# Patient Record
Sex: Male | Born: 1960 | Hispanic: No | Marital: Married | Smoking: Former smoker
Health system: Southern US, Community
[De-identification: ages and names within clinical notes are randomized; demographics above are authoritative.]

## PROBLEM LIST (undated history)

## (undated) ENCOUNTER — Ambulatory Visit: Attending: Family Medicine | Admitting: Family Medicine

## (undated) HISTORY — PX: APPENDECTOMY: SHX54

---

## 2007-11-10 ENCOUNTER — Emergency Department (HOSPITAL_COMMUNITY): Admission: EM | Admit: 2007-11-10 | Discharge: 2007-11-10 | Payer: Self-pay | Admitting: Emergency Medicine

## 2008-06-21 ENCOUNTER — Emergency Department (HOSPITAL_COMMUNITY): Admission: EM | Admit: 2008-06-21 | Discharge: 2008-06-21 | Payer: Self-pay | Admitting: Family Medicine

## 2008-09-22 ENCOUNTER — Encounter (INDEPENDENT_AMBULATORY_CARE_PROVIDER_SITE_OTHER): Payer: Self-pay | Admitting: General Surgery

## 2008-09-22 ENCOUNTER — Emergency Department (HOSPITAL_COMMUNITY): Admission: EM | Admit: 2008-09-22 | Discharge: 2008-09-22 | Payer: Self-pay | Admitting: Family Medicine

## 2008-09-22 ENCOUNTER — Observation Stay (HOSPITAL_COMMUNITY): Admission: EM | Admit: 2008-09-22 | Discharge: 2008-09-23 | Payer: Self-pay | Admitting: Emergency Medicine

## 2010-08-19 LAB — URINALYSIS, ROUTINE W REFLEX MICROSCOPIC
Glucose, UA: NEGATIVE mg/dL
Hgb urine dipstick: NEGATIVE
Specific Gravity, Urine: 1.034 — ABNORMAL HIGH (ref 1.005–1.030)
pH: 6 (ref 5.0–8.0)

## 2010-08-19 LAB — POCT I-STAT, CHEM 8
BUN: 10 mg/dL (ref 6–23)
Chloride: 104 mEq/L (ref 96–112)
Creatinine, Ser: 1.2 mg/dL (ref 0.4–1.5)
Potassium: 4.4 mEq/L (ref 3.5–5.1)
Sodium: 137 mEq/L (ref 135–145)

## 2010-08-19 LAB — DIFFERENTIAL
Basophils Absolute: 0.1 10*3/uL (ref 0.0–0.1)
Eosinophils Relative: 1 % (ref 0–5)
Lymphocytes Relative: 18 % (ref 12–46)
Lymphs Abs: 1.8 10*3/uL (ref 0.7–4.0)
Neutro Abs: 7.1 10*3/uL (ref 1.7–7.7)

## 2010-08-19 LAB — CBC
HCT: 41.8 % (ref 39.0–52.0)
Platelets: 214 10*3/uL (ref 150–400)
WBC: 10 10*3/uL (ref 4.0–10.5)

## 2010-08-26 LAB — CBC
HCT: 42.3 % (ref 39.0–52.0)
MCV: 87 fL (ref 78.0–100.0)
Platelets: 291 10*3/uL (ref 150–400)
RBC: 4.86 MIL/uL (ref 4.22–5.81)
WBC: 4.4 10*3/uL (ref 4.0–10.5)

## 2010-08-26 LAB — POCT I-STAT, CHEM 8
BUN: 13 mg/dL (ref 6–23)
Calcium, Ion: 1.18 mmol/L (ref 1.12–1.32)
Chloride: 102 mEq/L (ref 96–112)
Creatinine, Ser: 1 mg/dL (ref 0.4–1.5)
Sodium: 139 mEq/L (ref 135–145)
TCO2: 28 mmol/L (ref 0–100)

## 2010-08-26 LAB — DIFFERENTIAL
Eosinophils Absolute: 0.3 10*3/uL (ref 0.0–0.7)
Eosinophils Relative: 6 % — ABNORMAL HIGH (ref 0–5)
Lymphocytes Relative: 47 % — ABNORMAL HIGH (ref 12–46)
Lymphs Abs: 2.1 10*3/uL (ref 0.7–4.0)
Monocytes Relative: 12 % (ref 3–12)

## 2010-08-26 LAB — TSH: TSH: 0.466 u[IU]/mL (ref 0.350–4.500)

## 2010-09-23 NOTE — Op Note (Signed)
NAMEDEKOTA, SHENK                 ACCOUNT NO.:  [id number]   MEDICAL RECORD NO.:  [id number]          PATIENT TYPE:  INP   LOCATION:  5151                         FACILITY:  [facility]   PHYSICIAN:  Gabrielle Dare. Janee Morn, M.D.DATE OF BIRTH:  25-Apr-1961   DATE OF PROCEDURE:  09/22/2008  DATE OF DISCHARGE:                               OPERATIVE REPORT   PREOPERATIVE DIAGNOSIS:  Acute appendicitis.   POSTOPERATIVE DIAGNOSIS:  Acute appendicitis.   PROCEDURE:  Laparoscopic appendectomy.   SURGEON:  Gabrielle Dare. Janee Morn, MD   ANESTHESIA:  General endotracheal.   HISTORY OF PRESENT ILLNESS:  Mr. Koeppen is a 50 year old African  American gentleman who is originally from [place], who presented to the  emergency department with right lower quadrant abdominal pain.  CT scan  demonstrated early acute appendicitis and he was brought to the  emergently to the operating room for appendectomy.   PROCEDURE IN DETAIL:  Informed consent was obtained.  The patient was  identified in the preop holding area.  He received intravenous  antibiotics.  He was brought to the operating room and general  endotracheal anesthesia was administered by the anesthesia staff.  His  abdomen was prepped and draped in a sterile fashion after a Foley  catheter was placed by the nursing staff.  A time-out procedure was  done.  An infraumbilical area was infiltrated with 0.25% Marcaine.  An  infraumbilical incision was made.  Subcutaneous tissues were dissected  down revealing the anterior fascia.  This was divided sharply along the  midline.  The peritoneal cavity was entered under direct vision without  difficulty.  A 0 Vicryl pursestring suture was placed around the fascial  opening.  The Hasson trocar was inserted into the abdomen and the  abdomen was insufflated with carbon dioxide in a standard fashion.  Under direct vision, a 12-mm left lower quadrant port and one 5-mm right  mid abdomen port were placed.  A 0.25%  Marcaine with epinephrine was  used in both port sites.  Laparoscopic exploration revealed a dilated  and inflamed appendix.  It was not perforated.  The appendix was  retracted superiorly.  The mesoappendix was gradually divided with the  Harmonic scalpel, achieving excellent hemostasis.  Once we dissected  down to the base, the base was divided with endoscopic GIA with a  vascular load.  Then, the appendix was placed in an EndoCatch bag and  removed from the abdomen via the left lower quadrant port site.  The  abdomen was copiously irrigated with saline.  There was one tiny spot of  bleeding along the staple line that was cauterized.  The staple line  remained intact.  There was no further bleeding.  The abdomen was  copiously irrigated.  Irrigation fluid returned clear.  Staple lines  were rechecked and the mesoappendix and staple line remained completely  dry. The ports were then removed under direct vision. The  pneumoperitoneum was released.  The infraumbilical fascia was closed by  tiny 0-Vicryl pursestring suture with care not to trap any  intraabdominal contents.  All 3 wounds were copiously  irrigated and the  skin of each was closed with  a running 4-0 Vicryl subcuticular stitch followed by Dermabond.  The  sponge, needle, and instrument counts were correct. The patient  tolerated the procedure well without apparent complications and was  taken to the recovery room in stable condition.      Gabrielle Dare Janee Morn, M.D.  Electronically Signed     BET/MEDQ  D:  09/22/2008  T:  09/23/2008  Job:  [id number]

## 2010-09-23 NOTE — H&P (Signed)
Daniel Coleman, Daniel Coleman                 ACCOUNT NO.:  [id number]   MEDICAL RECORD NO.:  [id number]          PATIENT TYPE:  EMS   LOCATION:  URG                          FACILITY:  [facility]   PHYSICIAN:  Gabrielle Dare. Janee Morn, M.D.DATE OF BIRTH:  10-Jun-1960   DATE OF ADMISSION:  09/22/2008  DATE OF DISCHARGE:  09/22/2008                              HISTORY & PHYSICAL   CHIEF COMPLAINT:  Right lower quadrant abdominal pain.   HISTORY OF PRESENT ILLNESS:  Daniel Coleman is a 50 year old African  American gentleman who is originally from [place].  He developed right  lower quadrant abdominal pain last night, it has persisted.  He has not  had any significant nausea and vomiting.  He came to the [facility]  Emergency Department today.  Workup here shows white blood cell count  10,000.  CT scan of the abdomen and pelvis was done, which shows early  acute appendicitis.   PAST MEDICAL HISTORY:  Negative.   PAST SURGICAL HISTORY:  Negative.   SOCIAL HISTORY:  He quit smoking in December 2009.  He does not drink  alcohol.  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   MEDICATIONS:  None.   ALLERGIES:  No known drug allergies.   REVIEW OF SYSTEMS:  Include GI as above, otherwise negative.   PHYSICAL EXAMINATION:  VITAL SIGNS:  Temperature 97.9, pulse 71,  respirations 18, blood pressure 131/76.  GENERAL:  He is awake and alert.  He is in no distress.  HEENT: Pupils equal and reactive.  Sclerae is mildly injected.  Oral  mucosa is moist.  NECK:  Supple with no tenderness.  LUNGS:  Clear to auscultation.  HEART:  Regular with no murmurs and pulses palpable in left chest.  ABDOMEN:  Tenderness in the right lower quadrant with voluntary  guarding.  There is no Rovsing sign and no psoas sign.  Bowel sounds are  hypoactive.  No masses are felt.  There is no other localized  tenderness.  SKIN:  Warm and dry with no rashes.  NEUROLOGIC:  No focal deficit.  EXTREMITIES:  No deformity  or tenderness.   White blood cell count 10,000, hemoglobin 14.2, platelets 214.  Sodium  137, potassium 4.4, chloride 104, CO2 of 25, BUN 10, creatinine 1.2,  glucose 83.   IMPRESSION:  Acute appendicitis.   PLAN:  We will take him to the operating room for laparoscopic  appendectomy.  Procedure, risks, and benefits were discussed in detail  with the patient.  He agrees.  We will also give intravenous antibiotics  now.      Gabrielle Dare Janee Morn, M.D.  Electronically Signed     BET/MEDQ  D:  09/22/2008  T:  09/23/2008  Job:  [id number]

## 2010-09-26 NOTE — Discharge Summary (Signed)
Daniel Coleman, Daniel Coleman                 ACCOUNT NO.:  [id number]   MEDICAL RECORD NO.:  [id number]          PATIENT TYPE:  INP   LOCATION:  5151                         FACILITY:  [facility]   PHYSICIAN:  Sandria Bales. Ezzard Standing, M.D.  DATE OF BIRTH:  1960-09-23   DATE OF ADMISSION:  09/22/2008  DATE OF DISCHARGE:  09/23/2008                               DISCHARGE SUMMARY   DISCHARGING PHYSICIAN:  Dr. Ezzard Standing.   CHIEF COMPLAINT AND REASON FOR ADMISSION:  Daniel Coleman is a 50 year old  male patient who has had right lower quadrant abdominal pain for at  least 24 hours.  No nausea and vomiting.  He presented to the ER and was  found to be afebrile.  Vital signs were stable.  Abdominal exam was  consistent with possible appendicitis.  White count was 10,000.  CT was  consistent with early appendicitis.  The patient was admitted by Dr.  Janee Morn with following diagnosis; early acute appendicitis.   HOSPITAL COURSE:  The patient was admitted and was taken to the OR where  he underwent laparoscopic appendectomy for acute nonperforated  appendicitis.  He was sent back to the general floor to recover.   By postop day #1, the patient was eating breakfast, tolerating solid  diet, BP was 114/83, pulse 64, and temperature 98.5.  Abdomen was soft.  Bowel sounds were present.  Wounds were stable and he was otherwise  deemed appropriate for discharge home.   FINAL DISCHARGE DIAGNOSIS:  Acute appendicitis status post laparoscopic  appendectomy.   DISCHARGE MEDICATIONS:  The patient will resume Tylenol PM as prior to  admission.  In addition, he has been given generic of Percocet to use for pain as  well as been given a copy of home care instructions for laparoscopic  procedures by [facility].  Please refer to this for  details.   He is not to lift anything more than 10 pounds for 4 weeks and he is to  return to work in 1-2 weeks as directed by Dr. Janee Morn.  He is to call Dr. Carollee Massed office to  be seen in 3 weeks.      Allison L. Rennis Harding, N.P.      Sandria Bales. Ezzard Standing, M.D.  Electronically Signed    ALE/MEDQ  D:  11/06/2008  T:  11/07/2008  Job:  [id number]   cc:   Gabrielle Dare. Janee Morn, M.D.

## 2011-02-05 LAB — COMPREHENSIVE METABOLIC PANEL
BUN: 16
CO2: 29
Calcium: 9.8
Chloride: 103
Creatinine, Ser: 1.07
GFR calc Af Amer: >60
GFR calc non Af Amer: >60
Total Bilirubin: 1.3 — ABNORMAL HIGH

## 2011-02-05 LAB — DIFFERENTIAL
Basophils Relative: 1
Eosinophils Absolute: 0.2
Lymphocytes Relative: 38
Lymphs Abs: 1.5
Neutro Abs: 1.7
Neutrophils Relative %: 44

## 2011-02-05 LAB — CBC
HCT: 45.2
MCHC: 34.3
MCV: 87.5
RBC: 5.16
WBC: 3.9 — ABNORMAL LOW

## 2015-04-22 DIAGNOSIS — N200 Calculus of kidney: Secondary | ICD-10-CM | POA: Insufficient documentation

## 2016-03-25 DIAGNOSIS — M479 Spondylosis, unspecified: Secondary | ICD-10-CM | POA: Insufficient documentation

## 2016-12-01 ENCOUNTER — Ambulatory Visit
Admission: EM | Admit: 2016-12-01 | Discharge: 2016-12-01 | Disposition: A | Discharge status: Home / Self Care | Payer: BLUE CROSS/BLUE SHIELD | Attending: Family Medicine | Admitting: Family Medicine

## 2016-12-01 DIAGNOSIS — S0083XA Contusion of other part of head, initial encounter: Secondary | ICD-10-CM

## 2016-12-01 DIAGNOSIS — S39012A Strain of muscle, fascia and tendon of lower back, initial encounter: Secondary | ICD-10-CM

## 2016-12-01 MED ORDER — METAXALONE 800 MG PO TABS: 800.0000 mg | ORAL_TABLET | Freq: Three times a day (TID) | ORAL | 0 refills | Status: DC

## 2016-12-01 MED ORDER — MUPIROCIN 2 % EX OINT: 1.0000 "application " | TOPICAL_OINTMENT | Freq: Three times a day (TID) | CUTANEOUS | 0 refills | Status: DC

## 2016-12-01 MED ORDER — NAPROXEN 500 MG PO TABS: 500.0000 mg | ORAL_TABLET | Freq: Two times a day (BID) | ORAL | 0 refills | Status: DC
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
## 2016-12-09 ENCOUNTER — Encounter: Payer: Self-pay | Admitting: *Deleted

## 2016-12-09 ENCOUNTER — Ambulatory Visit
Admission: EM | Admit: 2016-12-09 | Discharge: 2016-12-09 | Disposition: A | Discharge status: Home / Self Care | Payer: BLUE CROSS/BLUE SHIELD | Attending: Family Medicine | Admitting: Family Medicine

## 2016-12-09 ENCOUNTER — Ambulatory Visit (INDEPENDENT_AMBULATORY_CARE_PROVIDER_SITE_OTHER): Payer: BLUE CROSS/BLUE SHIELD

## 2016-12-09 DIAGNOSIS — M5442 Lumbago with sciatica, left side: Secondary | ICD-10-CM

## 2016-12-09 MED ORDER — CYCLOBENZAPRINE HCL 5 MG PO TABS: 5.0000 mg | ORAL_TABLET | Freq: Two times a day (BID) | ORAL | 0 refills | Status: DC | PRN

## 2016-12-09 NOTE — ED Triage Notes (Signed)
Pt seen here last week for back pain. Returns today with same complaint but worse today.
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
## 2016-12-09 NOTE — Discharge Instructions (Signed)
Take medication as prescribed. Rest. Drink plenty of fluids. Stretch. Take the naproxen at home as prescribed, STOP the skelaxin.  Follow up with your primary care physician this week as needed. Follow up with the above as needed for continued pain. Return to Urgent care for new or worsening concerns.

## 2017-01-05 ENCOUNTER — Ambulatory Visit (INDEPENDENT_AMBULATORY_CARE_PROVIDER_SITE_OTHER): Payer: BLUE CROSS/BLUE SHIELD

## 2017-01-05 ENCOUNTER — Ambulatory Visit
Admission: EM | Admit: 2017-01-05 | Discharge: 2017-01-05 | Disposition: A | Discharge status: Home / Self Care | Payer: BLUE CROSS/BLUE SHIELD | Attending: Family Medicine | Admitting: Family Medicine

## 2017-01-05 DIAGNOSIS — M79672 Pain in left foot: Secondary | ICD-10-CM | POA: Diagnosis not present

## 2017-01-05 DIAGNOSIS — S92325A Nondisplaced fracture of second metatarsal bone, left foot, initial encounter for closed fracture: Secondary | ICD-10-CM | POA: Diagnosis not present

## 2017-01-05 NOTE — ED Triage Notes (Signed)
Patient complains of left foot pain. Patient states that he his foot on rock two weeks ago and has been constant with pain and swelling. Patient is swollen across top of foot.

## 2017-01-05 NOTE — ED Provider Notes (Signed)
[facility] ____________________________________________  Time seen: Approximately 4:26 PM  I have reviewed the triage vital signs and the nursing notes.   HISTORY  Chief Complaint Foot Pain (left)   HPI Daniel Coleman is a 56 y.o. male presenting for evaluation of left foot pain has been present for approximately 3 weeks. Patient states he injured left foot by accidentally stepping on a rock and rolling his foot forward. Denies any break in skin or laceration. States wanted to make sure he did not break his foot. States he has had pain since injury. Reports has continued to ambulate. States he has applied some ice which helps some, no other alleviating measures attempted. Further states that he has been walking a lot on his foot. Denies any other injury to the same area in past. Reports otherwise feels well. States recently seen in urgent care for back pain, and states back pain is now resolved. Denies redness, drainage, paresthesias, decreased range of motion or other complaints. States no previous injury to the same area. States pain is minimal at this time, worse with direct palpation or with ambulation. Denies other injury. Denies calf pain.   Denies chest pain, shortness of breath, abdominal pain, dysuria, pain radiation, other extremity pain, other extremity swelling or rash. Denies recent sickness. Denies recent antibiotic use.    History reviewed. No pertinent past medical history. Denies   There are no active problems to display for this patient.   Past Surgical History:  Procedure Laterality Date  . APPENDECTOMY       No current facility-administered medications for this encounter.   Current Outpatient Prescriptions:  .  cyclobenzaprine (FLEXERIL) 5 MG tablet, Take 1 tablet (5 mg total) by mouth 2 (two) times daily as needed for muscle spasms. Do not drive while taking as can cause drowsiness, Disp: 15 tablet, Rfl: 0 .  mupirocin ointment (BACTROBAN) 2 %,  Apply 1 application topically 3 (three) times daily., Disp: 22 g, Rfl: 0 .  naproxen (NAPROSYN) 500 MG tablet, Take 1 tablet (500 mg total) by mouth 2 (two) times daily., Disp: 30 tablet, Rfl: 0  Allergies Patient has no known allergies.  Family History  Problem Relation Age of Onset  . Healthy Mother   . Diabetes Father     Social History Social History  Substance Use Topics  . Smoking status: Former Games developer  . Smokeless tobacco: Former Neurosurgeon  . Alcohol use No    Review of Systems Constitutional: No fever/chills Cardiovascular: Denies chest pain. Respiratory: Denies shortness of breath. Gastrointestinal: No abdominal pain.   Musculoskeletal: Negative for back pain. As above.  Skin: Negative for rash.  ____________________________________________   PHYSICAL EXAM:  VITAL SIGNS: ED Triage Vitals  Enc Vitals Group     BP 01/05/17 1622 122/80     Pulse Rate 01/05/17 1622 84     Resp 01/05/17 1622 18     Temp 01/05/17 1622 98.5 F (36.9 C)     Temp Source 01/05/17 1622 Oral     SpO2 01/05/17 1622 99 %     Weight 01/05/17 1620 180 lb (81.6 kg)     Height 01/05/17 1620 5\' 9"  (1.753 m)     Head Circumference --      Peak Flow --      Pain Score 01/05/17 1620 8     Pain Loc --      Pain Edu? --      Excl. in [place]? --     Constitutional: Alert and  oriented. Well appearing and in no acute distress. Cardiovascular: Normal rate, regular rhythm. Grossly normal heart sounds.  Good peripheral circulation. Respiratory: Normal respiratory effort without tachypnea nor retractions. Breath sounds are clear and equal bilaterally. No wheezes, rales, rhonchi. Musculoskeletal:No midline cervical, thoracic or lumbar tenderness to palpation. Bilateral pedal pulses equal and easily palpated. Except : Left dorsal foot at distal third and fourth metatarsals mild tenderness to palpation, left distal foot at distal second metatarsal moderate tenderness to palpation with mild localized edema, no  ecchymosis, skin intact, left foot with full range of motion present, mild pain with dorsiflexion, normal distal sensation and capillary refill. Ambulatory with mild antalgic gait. Left lower extremity otherwise nontender.  Neurologic:  Normal speech and language.  Speech is normal. No gait instability.  Skin:  Skin is warm, dry. Psychiatric: Mood and affect are normal. Speech and behavior are normal. Patient exhibits appropriate insight and judgment   ___________________________________________   LABS (all labs ordered are listed, but only abnormal results are displayed)  Labs Reviewed - No data to display ____________________________________________  RADIOLOGY  Dg Foot Complete Left  Result Date: 01/05/2017 CLINICAL DATA:  Left foot pain, hit foot on rock 2 weeks ago with persistent swelling EXAM: LEFT FOOT - COMPLETE 3+ VIEW COMPARISON:  None. FINDINGS: There is a subacute slightly impacted fracture of the distal left second metatarsal with faint callus formation. No other acute abnormality is seen. Joint spaces appear normal. IMPRESSION: Subacute slightly impacted fracture of the distal left metatarsal with some callus formation. Electronically Signed   By: Dwyane Dee M.D.   On: 01/05/2017 16:49   ____________________________________________   PROCEDURES Procedures   Cam boot applied by CMA.  INITIAL IMPRESSION / ASSESSMENT AND PLAN / ED COURSE  Pertinent labs & imaging results that were available during my care of the patient were reviewed by me and considered in my medical decision making (see chart for details).  Well appearing patient. Left foot pain times approximately 3 weeks from mechanical injury. Denies other injuries or pain. Left foot x-ray per radiologist subacute slightly impacted fracture of the distal left metatarsal with some callus formation. Discussed x-ray results in detail with patient. Patient has continued to remain ambulatory as well as the length of time,  patient placed in boot. Recommend close follow-up with podiatry, and recommended to call tomorrow to schedule appointment. Patient states he still has naproxen at home and will take as needed for pain. Encouraged rest ice and elevation. Podiatry information given.   Discussed follow up and return parameters including no resolution or any worsening concerns. Patient verbalized understanding and agreed to plan.   ____________________________________________   FINAL CLINICAL IMPRESSION(S) / ED DIAGNOSES  Final diagnoses:  Nondisplaced fracture of second metatarsal bone, left foot, initial encounter for closed fracture  Left foot pain     Discharge Medication List as of 01/05/2017  5:10 PM      Note: This dictation was prepared with Dragon dictation along with smaller phrase technology. Any transcriptional errors that result from this process are unintentional.         Renford Dills, NP 01/05/17 1932

## 2017-01-05 NOTE — Discharge Instructions (Signed)
Take home naproxen as prescribed. Rest. Ice. Wear boot.   Follow up with podiatry this week. See above to call.   Follow up with your primary care physician this week as needed. Return to Urgent care for new or worsening concerns.

## 2017-05-06 DIAGNOSIS — R238 Other skin changes: Secondary | ICD-10-CM | POA: Insufficient documentation

## 2017-05-06 DIAGNOSIS — R059 Cough, unspecified: Secondary | ICD-10-CM | POA: Insufficient documentation

## 2017-05-24 ENCOUNTER — Ambulatory Visit
Admission: RE | Admit: 2017-05-24 | Discharge: 2017-05-24 | Disposition: A | Discharge status: Home / Self Care | Payer: BLUE CROSS/BLUE SHIELD | Source: Ambulatory Visit | Attending: Family Medicine | Admitting: Family Medicine

## 2017-05-24 ENCOUNTER — Other Ambulatory Visit: Payer: Self-pay | Admitting: Family Medicine

## 2017-05-24 DIAGNOSIS — R05 Cough: Secondary | ICD-10-CM

## 2017-05-24 DIAGNOSIS — R053 Chronic cough: Secondary | ICD-10-CM

## 2018-03-05 IMAGING — CR DG FOOT COMPLETE 3+V*L*
3 series · 3 of 3 positions shown · non-contrast
Comparison: None.

CLINICAL DATA: Left foot pain, hit foot on rock 2 weeks ago with
persistent swelling

EXAM:
LEFT FOOT - COMPLETE 3+ VIEW

[foot ap]
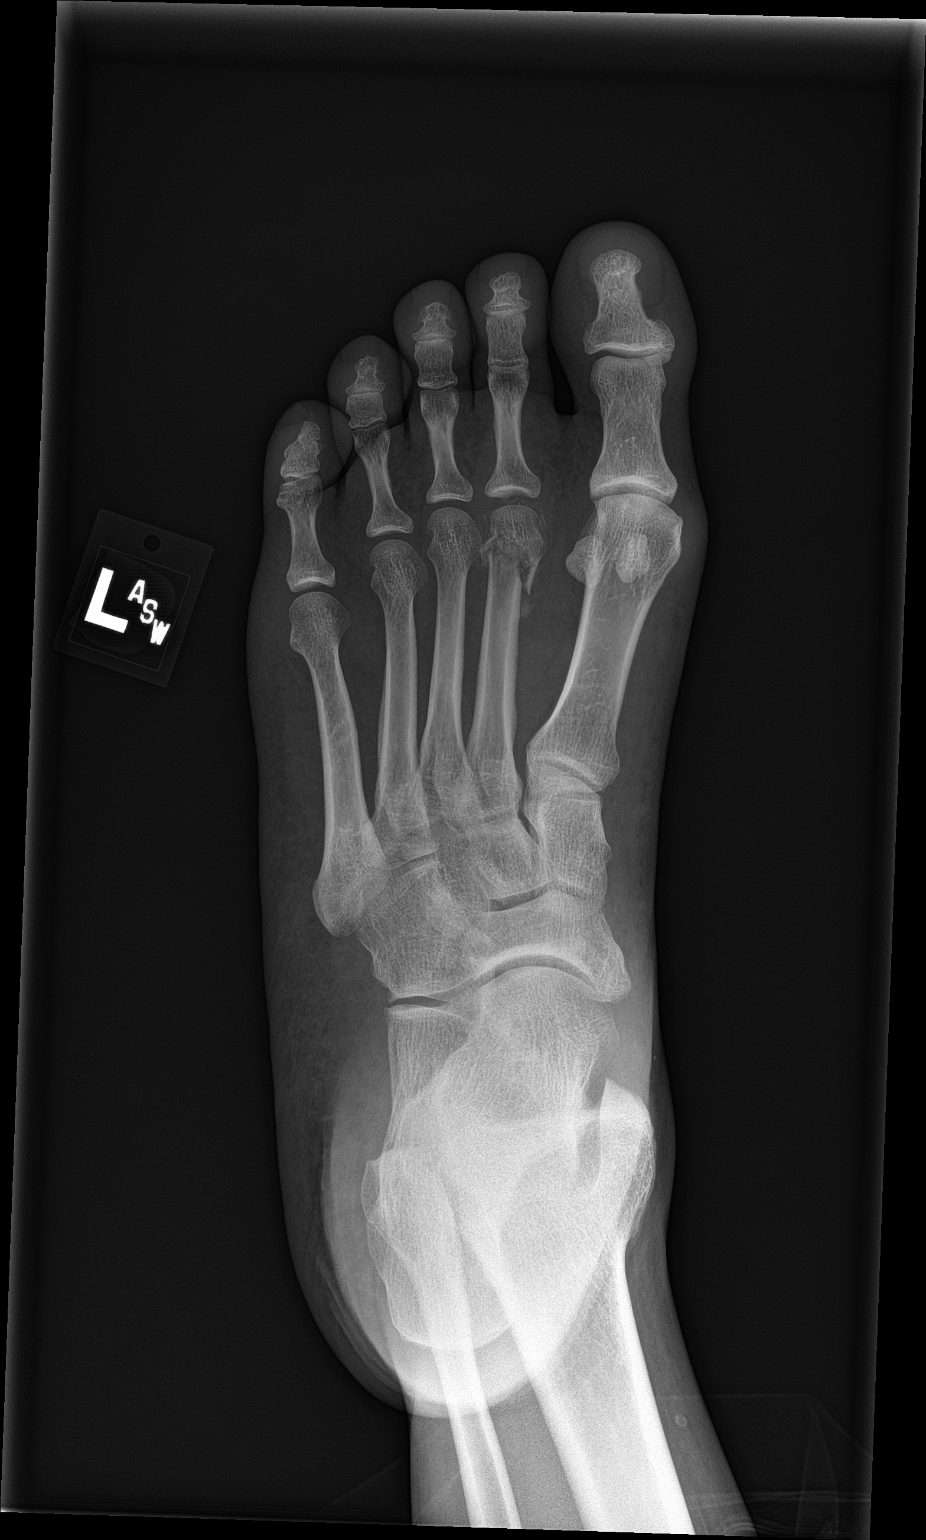

[foot obl]
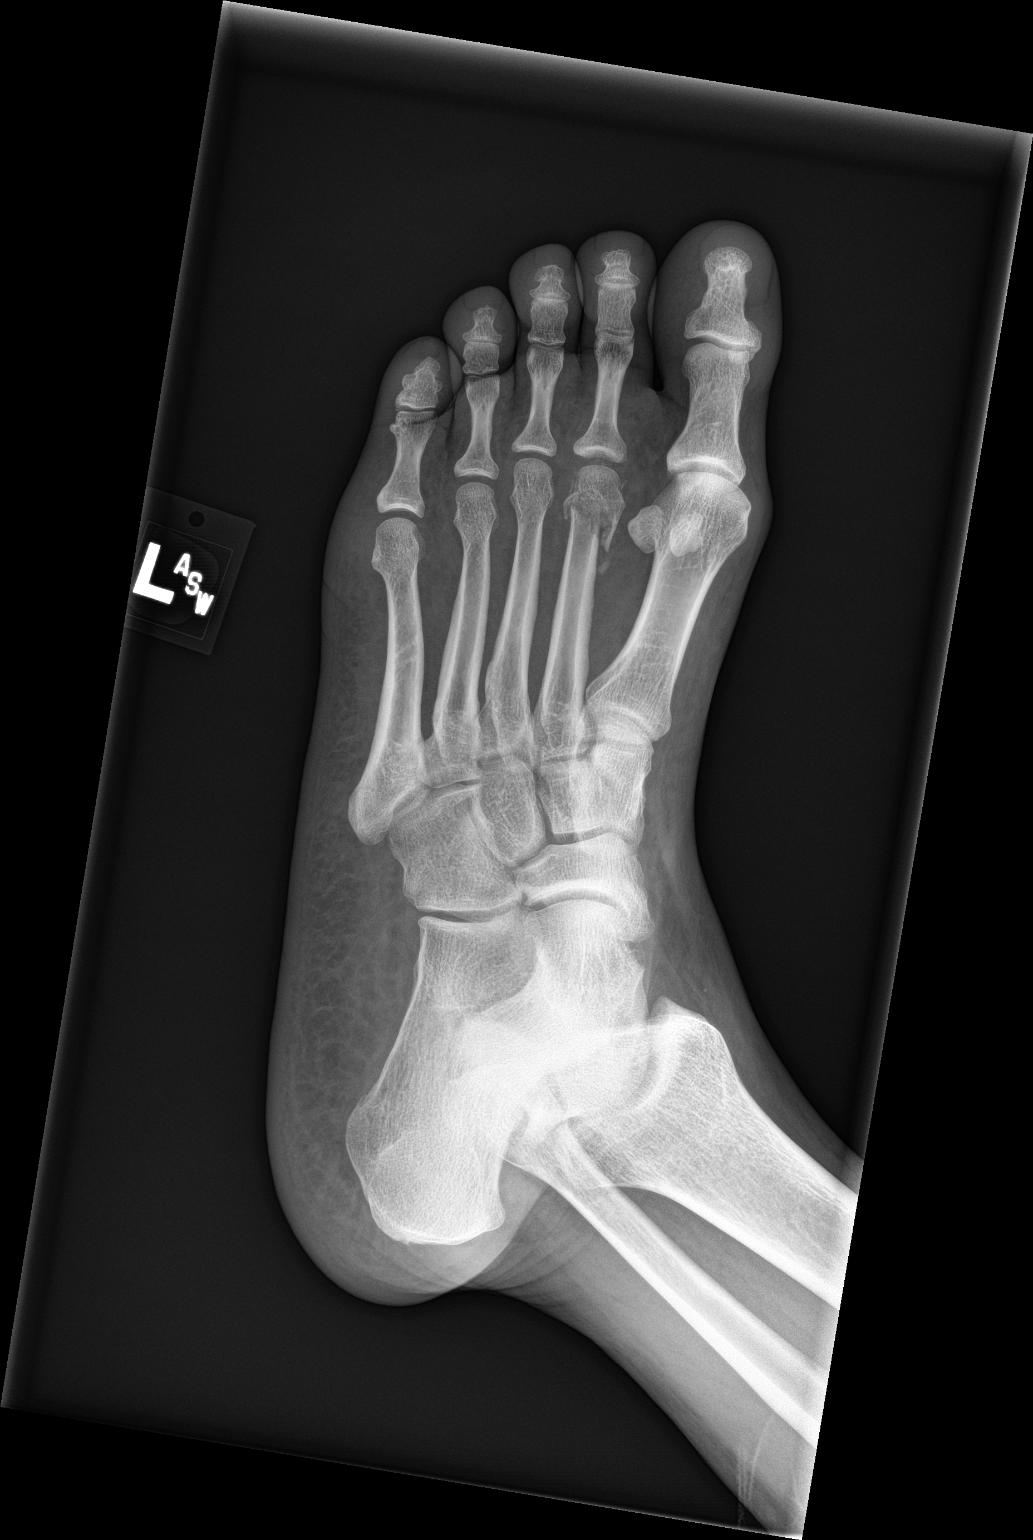

[foot lat]
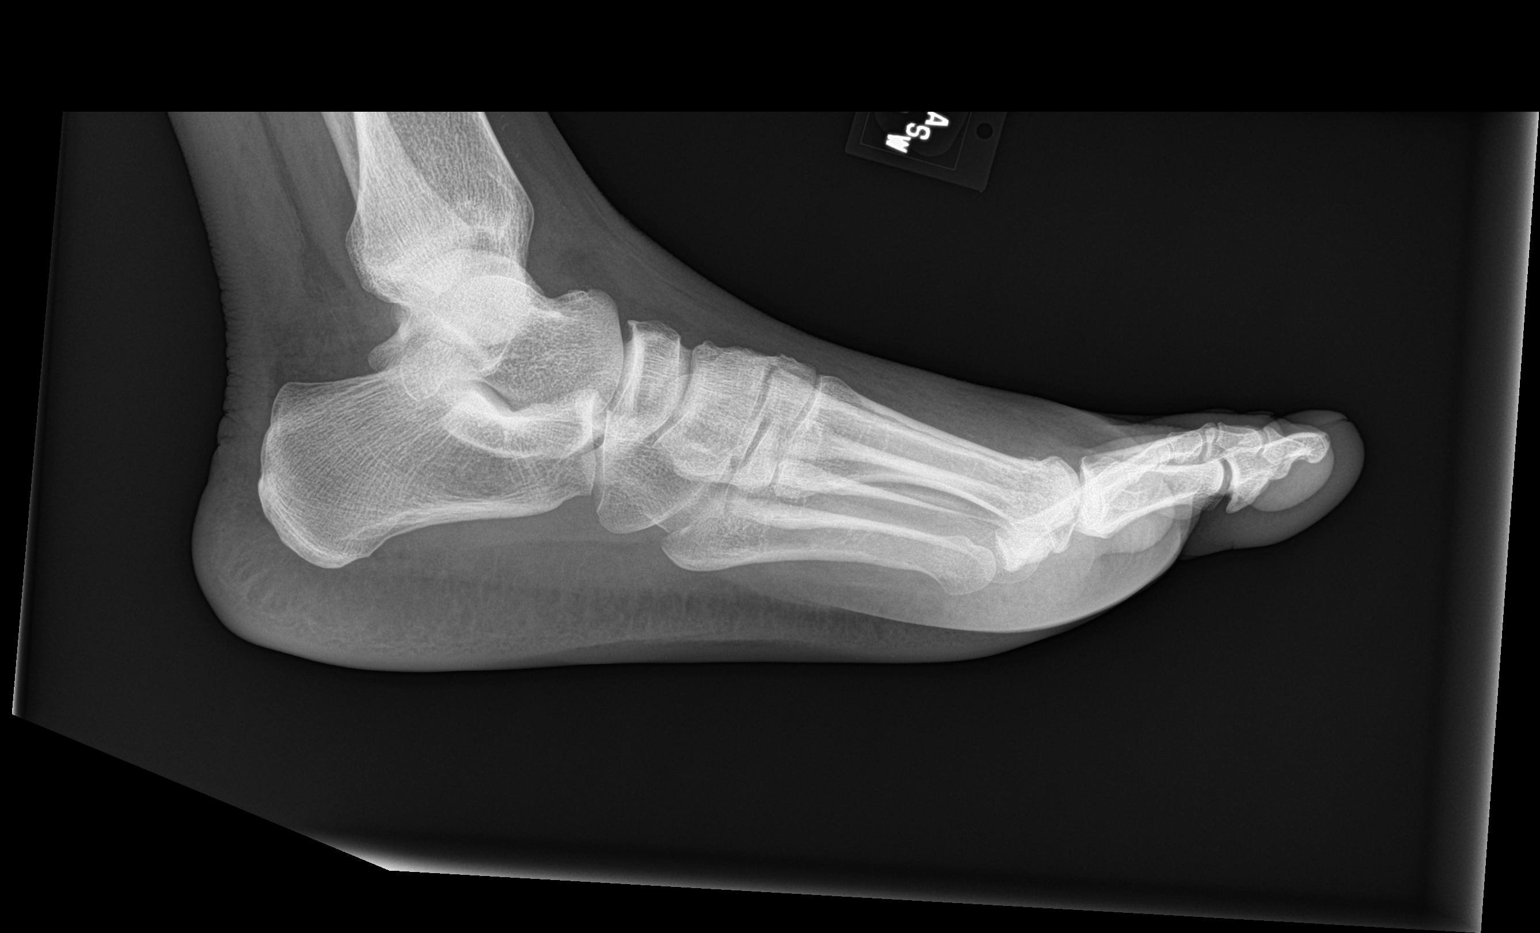

[3 of 3 positions shown; findings below may reference images not displayed]

FINDINGS: There is a subacute slightly impacted fracture of the distal left
second metatarsal with faint callus formation. No other acute
abnormality is seen. Joint spaces appear normal.
IMPRESSION: Subacute slightly impacted fracture of the distal left metatarsal
with some callus formation.

## 2018-07-22 IMAGING — CR DG CHEST 2V
2 series · 2 of 2 positions shown · non-contrast
Comparison: None

CLINICAL DATA: Chronic cough for 2 weeks

EXAM:
CHEST  2 VIEW

[chest pa]
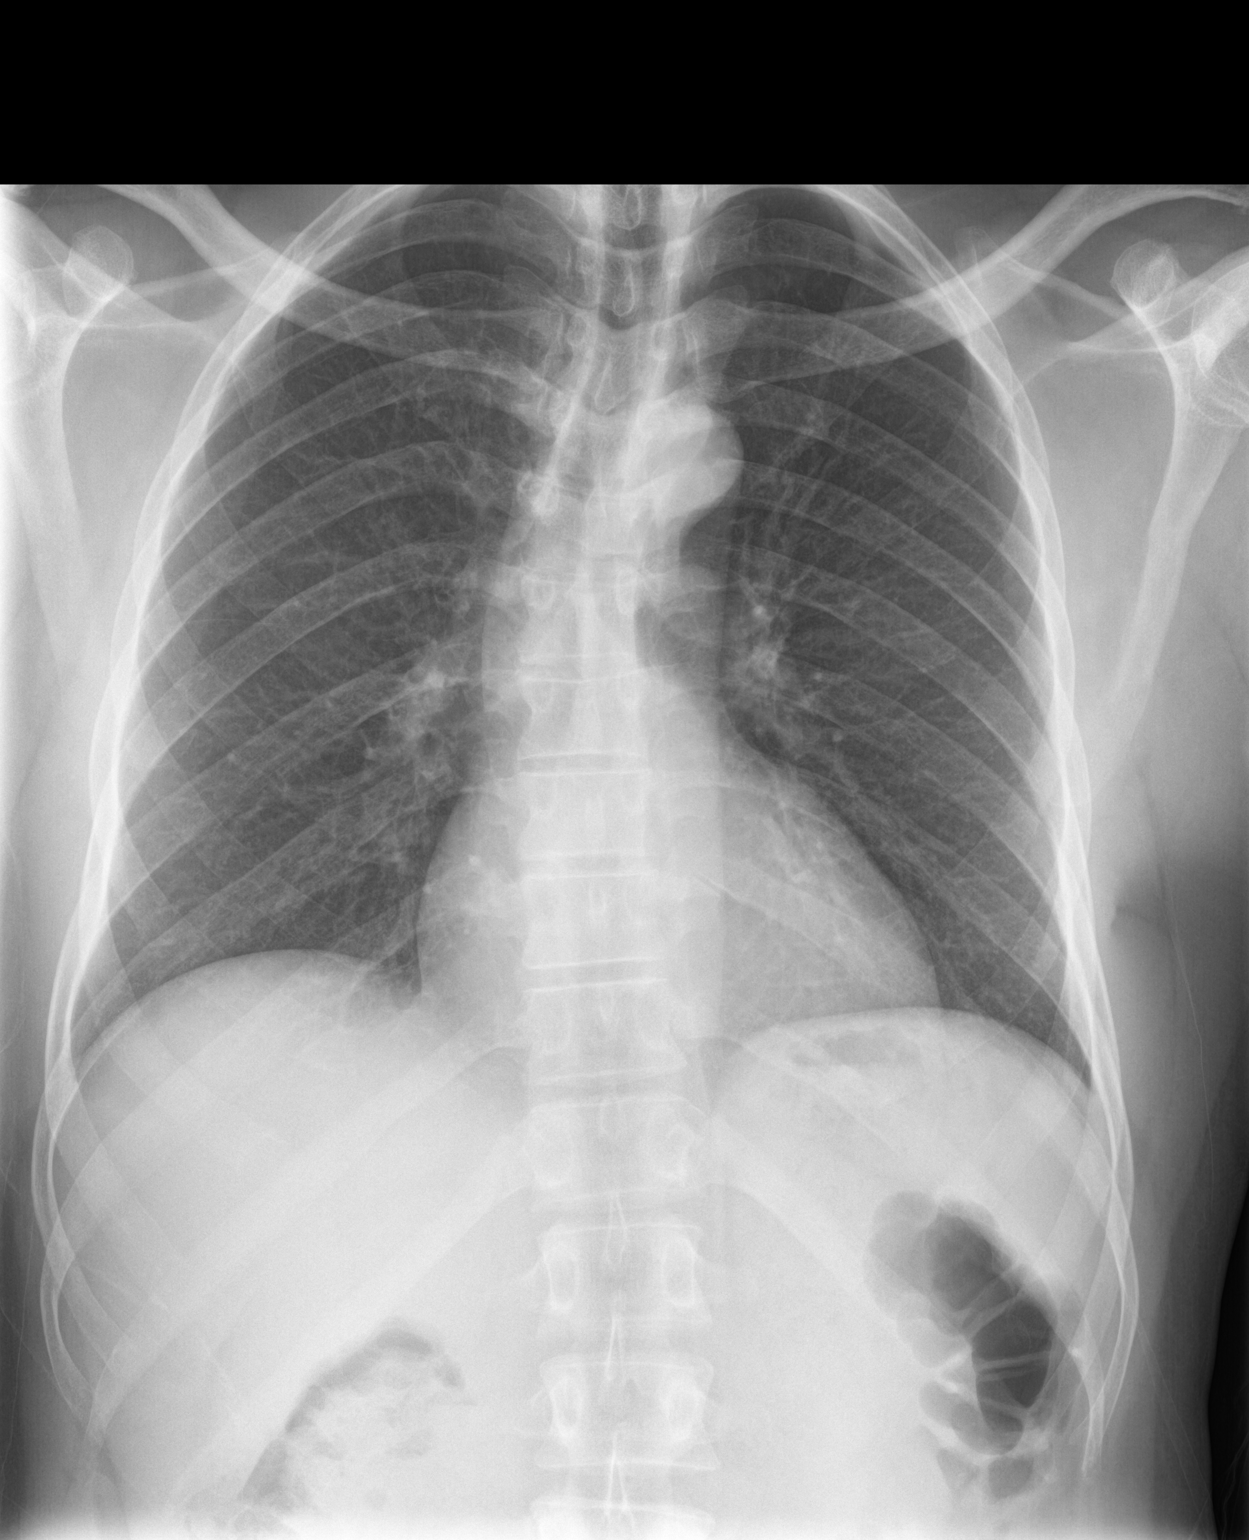

[chest lat]
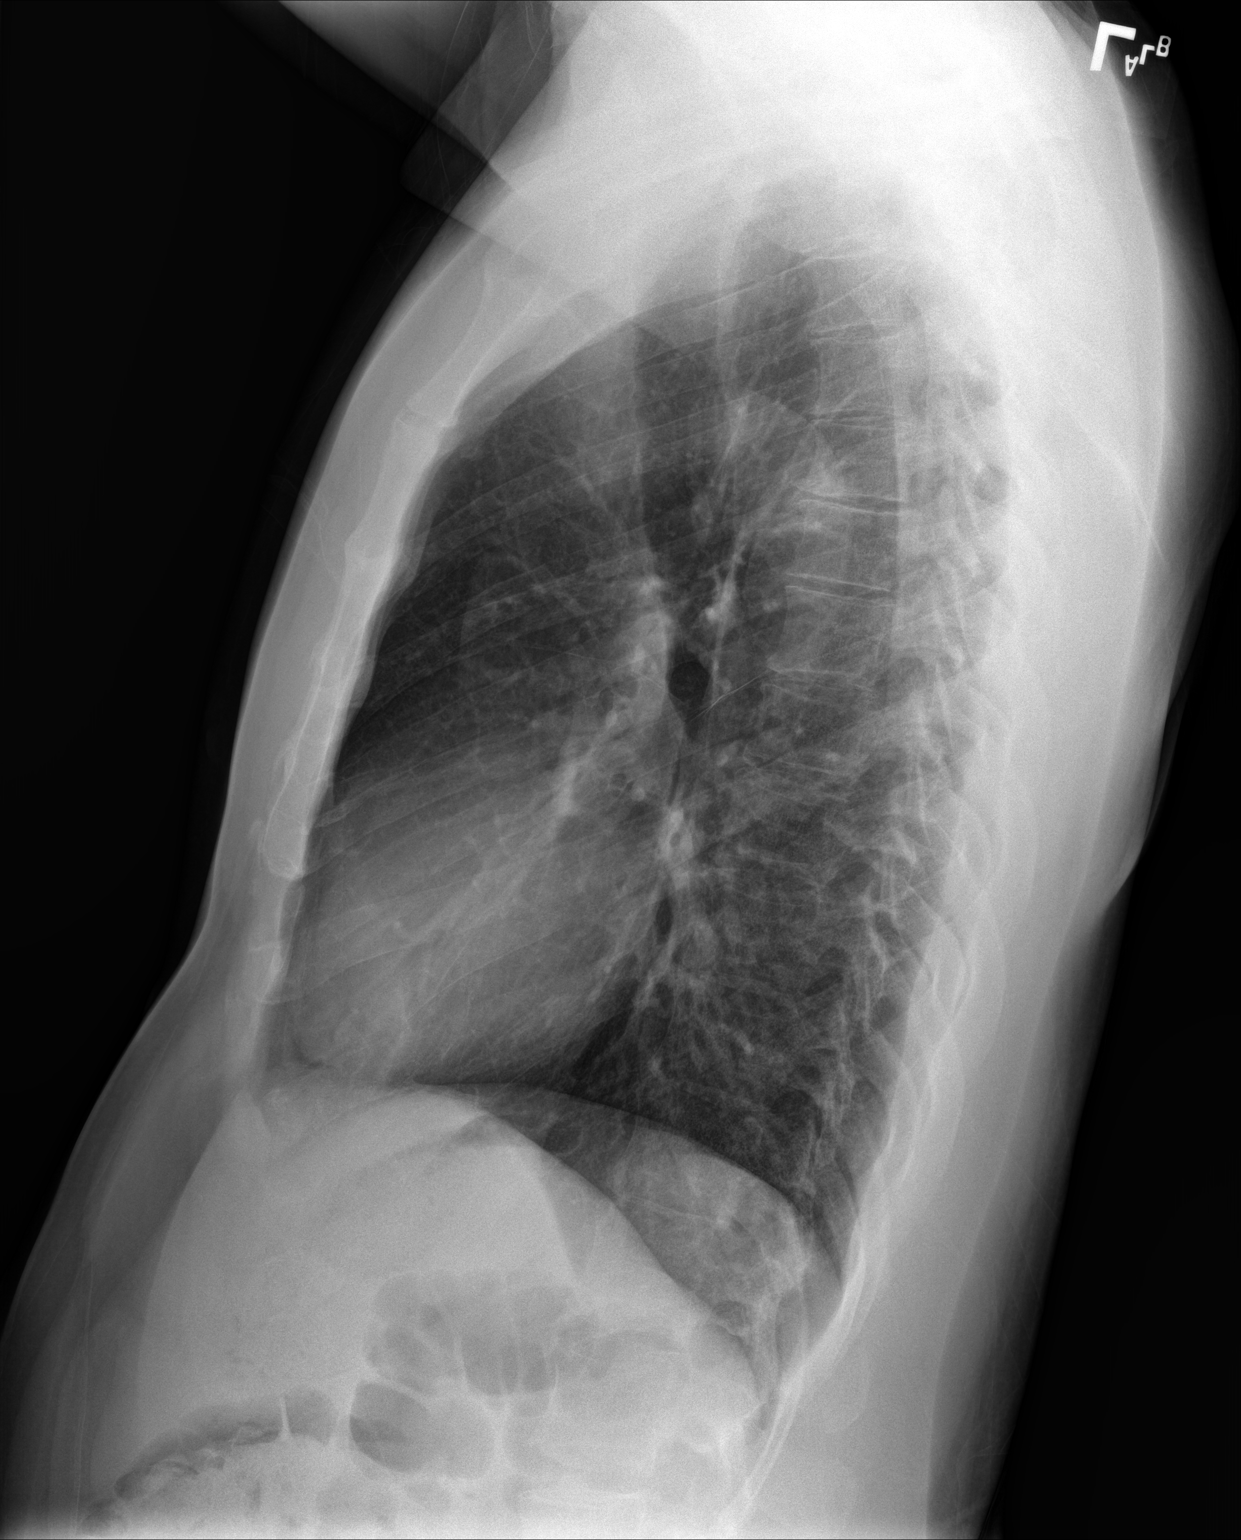

[2 of 2 positions shown; findings below may reference images not displayed]

FINDINGS: The heart size and mediastinal contours are within normal limits.
Both lungs are clear. The visualized skeletal structures are
unremarkable.
IMPRESSION: No active cardiopulmonary disease.

## 2018-10-23 ENCOUNTER — Ambulatory Visit (INDEPENDENT_AMBULATORY_CARE_PROVIDER_SITE_OTHER): Payer: Managed Care, Other (non HMO)

## 2018-10-23 ENCOUNTER — Ambulatory Visit
Admission: EM | Admit: 2018-10-23 | Discharge: 2018-10-23 | Disposition: A | Discharge status: Home / Self Care | Payer: Managed Care, Other (non HMO) | Attending: Family Medicine | Admitting: Family Medicine

## 2018-10-23 DIAGNOSIS — R1032 Left lower quadrant pain: Secondary | ICD-10-CM

## 2018-10-23 DIAGNOSIS — K5900 Constipation, unspecified: Secondary | ICD-10-CM

## 2018-10-23 MED ORDER — CIPROFLOXACIN HCL 500 MG PO TABS: 500.0000 mg | ORAL_TABLET | Freq: Two times a day (BID) | ORAL | 0 refills | Status: DC

## 2018-10-23 MED ORDER — METRONIDAZOLE 500 MG PO TABS: 500.0000 mg | ORAL_TABLET | Freq: Three times a day (TID) | ORAL | 0 refills | Status: DC

## 2018-10-23 NOTE — Discharge Instructions (Addendum)
Increase water intake

## 2018-10-23 NOTE — ED Provider Notes (Signed)
[facility]    CSN: [id number] Arrival date & time: 10/23/18  1447     History   Chief Complaint Chief Complaint  Patient presents with  . Abdominal Pain    HPI Daniel Coleman is a 58 y.o. male.   58 yo male with a c/o left lower abdominal pain for the past 2 days. States pain comes and goes. Denies any fevers, chills, nausea/vomiting, diarrhea, dysuria, hematuria. States he's had some constipation and took some Maalox yesterday which did help ease the pain after having a bowel movement.    Abdominal Pain   History reviewed. No pertinent past medical history.  There are no active problems to display for this patient.   Past Surgical History:  Procedure Laterality Date  . APPENDECTOMY         Home Medications    Prior to Admission medications   Medication Sig Start Date End Date Taking? Authorizing Provider  ciprofloxacin (CIPRO) 500 MG tablet Take 1 tablet (500 mg total) by mouth every 12 (twelve) hours. 10/23/18   Norval Gable, MD  cyclobenzaprine (FLEXERIL) 5 MG tablet Take 1 tablet (5 mg total) by mouth 2 (two) times daily as needed for muscle spasms. Do not drive while taking as can cause drowsiness 12/09/16   Marylene Land, NP  metroNIDAZOLE (FLAGYL) 500 MG tablet Take 1 tablet (500 mg total) by mouth 3 (three) times daily. 10/23/18   Norval Gable, MD  mupirocin ointment (BACTROBAN) 2 % Apply 1 application topically 3 (three) times daily. 12/01/16   Lorin Picket, PA-C  naproxen (NAPROSYN) 500 MG tablet Take 1 tablet (500 mg total) by mouth 2 (two) times daily. 12/01/16   Lorin Picket, PA-C    Family History Family History  Problem Relation Age of Onset  . Healthy Mother   . Diabetes Father     Social History Social History   Tobacco Use  . Smoking status: Former Research scientist (life sciences)  . Smokeless tobacco: Former Network engineer Use Topics  . Alcohol use: No  . Drug use: No     Allergies   Patient has no known allergies.   Review of  Systems Review of Systems  Gastrointestinal: Positive for abdominal pain.     Physical Exam Triage Vital Signs ED Triage Vitals  Enc Vitals Group     BP 10/23/18 1456 117/80     Pulse Rate 10/23/18 1456 75     Resp 10/23/18 1456 18     Temp 10/23/18 1456 98.3 F (36.8 C)     Temp Source 10/23/18 1456 Oral     SpO2 10/23/18 1456 100 %     Weight 10/23/18 1458 185 lb (83.9 kg)     Height --      Head Circumference --      Peak Flow --      Pain Score 10/23/18 1458 6     Pain Loc --      Pain Edu? --      Excl. in [place]? --    No data found.  Updated Vital Signs BP 117/80 (BP Location: Right Arm)   Pulse 75   Temp 98.3 F (36.8 C) (Oral)   Resp 18   Wt 83.9 kg   SpO2 100%   BMI 27.32 kg/m   Visual Acuity Right Eye Distance:   Left Eye Distance:   Bilateral Distance:    Right Eye Near:   Left Eye Near:    Bilateral Near:     Physical  Exam Vitals signs and nursing note reviewed.  Constitutional:      General: He is not in acute distress.    Appearance: He is not toxic-appearing or diaphoretic.  Cardiovascular:     Rate and Rhythm: Normal rate.  Pulmonary:     Effort: Pulmonary effort is normal. No respiratory distress.     Breath sounds: No stridor.  Abdominal:     General: There is no distension.     Palpations: Abdomen is soft. There is no mass.     Tenderness: There is abdominal tenderness (left lower quadrant; no rebound or guarding). There is no right CVA tenderness, left CVA tenderness, guarding or rebound.     Hernia: No hernia is present.  Neurological:     Mental Status: He is alert.      UC Treatments / Results  Labs (all labs ordered are listed, but only abnormal results are displayed) Labs Reviewed - No data to display  EKG None  Radiology Dg Abd 2 Views  Result Date: 10/23/2018 CLINICAL DATA:  LEFT lower quadrant abdominal pain since Friday EXAM: ABDOMEN - 2 VIEW COMPARISON:  11/10/2007 FINDINGS: Mildly prominent stool throughout  colon. Nonobstructive bowel gas pattern. No bowel dilatation or bowel wall thickening. No free air. Bones unremarkable. No urinary tract calcification. IMPRESSION: Mildly prominent stool throughout colon. Electronically Signed   By: Ulyses SouthwardMark  Boles M.D.   On: 10/23/2018 15:56    Procedures Procedures (including critical care time)  Medications Ordered in UC Medications - No data to display  Initial Impression / Assessment and Plan / UC Course  I have reviewed the triage vital signs and the nursing notes.  Pertinent labs & imaging results that were available during my care of the patient were reviewed by me and considered in my medical decision making (see chart for details).      Final Clinical Impressions(s) / UC Diagnoses   Final diagnoses:  Abdominal pain, left lower quadrant  Constipation, unspecified constipation type  (presumptive diverticulitis)  ED Prescriptions    Medication Sig Dispense Auth. Provider   ciprofloxacin (CIPRO) 500 MG tablet Take 1 tablet (500 mg total) by mouth every 12 (twelve) hours. 14 tablet Myalee Stengel, Pamala Hurryrlando, MD   metroNIDAZOLE (FLAGYL) 500 MG tablet Take 1 tablet (500 mg total) by mouth 3 (three) times daily. 21 tablet Payton Mccallumonty, Ayzia Day, MD     1. x-ray results and diagnosis reviewed with patient 2. rx as per orders above; reviewed possible side effects, interactions, risks and benefits  3. Recommend supportive treatment with increased water and fiber intake 4. Go to Emergency Department if symptoms worsen 5. Follow-up prn if symptoms worsen or don't improve  Controlled Substance Prescriptions [place] Controlled Substance Registry consulted? Not Applicable   Payton Mccallumonty, Kellis Mcadam, MD 10/23/18 [id number]

## 2018-10-23 NOTE — ED Triage Notes (Signed)
Pt complains of LLQ pain since Friday. Did try some Maalox and it did ease up. Pain isn't constant does come and go. Last bowel movement this morning after Maalox. No reports of nausea or vomiting.

## 2018-11-17 ENCOUNTER — Other Ambulatory Visit: Payer: Self-pay | Admitting: Gastroenterology

## 2018-11-17 DIAGNOSIS — R1032 Left lower quadrant pain: Secondary | ICD-10-CM

## 2018-11-21 ENCOUNTER — Other Ambulatory Visit: Payer: Self-pay

## 2018-11-21 ENCOUNTER — Ambulatory Visit
Admission: RE | Admit: 2018-11-21 | Discharge: 2018-11-21 | Disposition: A | Discharge status: Home / Self Care | Payer: Managed Care, Other (non HMO) | Source: Ambulatory Visit | Attending: Gastroenterology | Admitting: Gastroenterology

## 2018-11-21 DIAGNOSIS — R1032 Left lower quadrant pain: Secondary | ICD-10-CM | POA: Insufficient documentation

## 2018-11-21 MED ORDER — IOHEXOL 300 MG/ML  SOLN
100.0000 mL | Freq: Once | INTRAMUSCULAR | Status: AC | PRN
  Administered 2018-11-21: 100 mL via INTRAVENOUS

## 2019-05-17 ENCOUNTER — Ambulatory Visit
Admission: EM | Admit: 2019-05-17 | Discharge: 2019-05-17 | Disposition: A | Discharge status: Home / Self Care | Payer: Managed Care, Other (non HMO) | Attending: Emergency Medicine | Admitting: Emergency Medicine

## 2019-05-17 ENCOUNTER — Other Ambulatory Visit: Payer: Self-pay

## 2019-05-17 DIAGNOSIS — L0291 Cutaneous abscess, unspecified: Secondary | ICD-10-CM | POA: Diagnosis not present

## 2019-05-17 MED ORDER — CHLORHEXIDINE GLUCONATE 4 % EX LIQD: Freq: Every day | CUTANEOUS | 0 refills | Status: DC | PRN

## 2019-05-17 MED ORDER — IBUPROFEN 600 MG PO TABS: 600.0000 mg | ORAL_TABLET | Freq: Four times a day (QID) | ORAL | 0 refills | Status: DC | PRN

## 2019-05-17 MED ORDER — DOXYCYCLINE HYCLATE 100 MG PO CAPS: 100.0000 mg | ORAL_CAPSULE | Freq: Two times a day (BID) | ORAL | 0 refills | Status: AC

## 2019-05-17 NOTE — Discharge Instructions (Addendum)
Warm compresses.  Take 600 mg of ibuprofen with the 1000 mg of Tylenol 3-4 times a day as needed for pain.  Keep area clean and dry for 24 hours, then you may take off the bandage and start cleaning it with a chlorhexidine soap.  Keep it covered until it heals although I do give it some dry time.  Finish the antibiotics, even if you feel better.  Follow-up with your primary care physician in several days, go immediately to the ER for fevers, body aches, if your leg becomes red, hot, significantly swollen, for pain not controlled with Tylenol/ibuprofen combination, or for any other concerns.

## 2019-05-17 NOTE — ED Triage Notes (Signed)
Patient presents to Urgent Care with complaints of open abscess on his LLE since about a week ago. Patient reports he picked it and white liquid drained out of it, states it is very painful.

## 2019-05-17 NOTE — ED Provider Notes (Signed)
HPI  SUBJECTIVE:  Daniel Coleman is a 59 y.o. male who presents with a painful white "bump" on his posterior left lower extremity starting 1 week ago.  States that it has started draining white purulent material.  He is unable to describe the pain but states it is constant.  He states that he has felt feverish, but has had no documented fevers.  No body aches, erythema, swelling.  No trauma, insect bite.  No contacts with MRSA.  He tried squeezing it with worsening of his symptoms.  No alleviating factors.  Has not tried anything else for this.  Past medical history negative for diabetes, MRSA,.  PMD: [facility].   History reviewed. No pertinent past medical history.  Past Surgical History:  Procedure Laterality Date  . APPENDECTOMY      Family History  Problem Relation Age of Onset  . Healthy Mother   . Diabetes Father     Social History   Tobacco Use  . Smoking status: Former Research scientist (life sciences)  . Smokeless tobacco: Former Network engineer Use Topics  . Alcohol use: No  . Drug use: No    No current facility-administered medications for this encounter.  Current Outpatient Medications:  .  chlorhexidine (HIBICLENS) 4 % external liquid, Apply topically daily as needed. Dilute 10-15 mL in water, Use daily when bathing for 1-2 weeks, Disp: 120 mL, Rfl: 0 .  cyclobenzaprine (FLEXERIL) 5 MG tablet, Take 1 tablet (5 mg total) by mouth 2 (two) times daily as needed for muscle spasms. Do not drive while taking as can cause drowsiness, Disp: 15 tablet, Rfl: 0 .  doxycycline (VIBRAMYCIN) 100 MG capsule, Take 1 capsule (100 mg total) by mouth 2 (two) times daily for 5 days., Disp: 10 capsule, Rfl: 0 .  ibuprofen (ADVIL) 600 MG tablet, Take 1 tablet (600 mg total) by mouth every 6 (six) hours as needed., Disp: 30 tablet, Rfl: 0  No Known Allergies   ROS  As noted in HPI.   Physical Exam  BP 126/73 (BP Location: Left Arm)   Pulse 75   Temp 98.6 F (37 C) (Oral)   Resp 16   SpO2 100%    Constitutional: Well developed, well nourished, no acute distress Eyes:  EOMI, conjunctiva normal bilaterally HENT: Normocephalic, atraumatic,mucus membranes moist Respiratory: Normal inspiratory effort Cardiovascular: Normal rate GI: nondistended skin: No rash, skin intact Musculoskeletal: Tender lesion posterior left lower extremity with no surrounding erythema.  Small surrounding induration.  No central fluctuance.  No expressible purulent drainage.  See picture.     Neurologic: Alert & oriented x 3, no focal neuro deficits Psychiatric: Speech and behavior appropriate   ED Course   Medications - No data to display  Orders Placed This Encounter  Procedures  . Aerobic Culture (superficial specimen)    Standing Status:   Standing    Number of Occurrences:   1    No results found for this or any previous visit (from the past 24 hour(s)). No results found.  ED Clinical Impression  1. Abscess      ED Assessment/Plan  Patient refused a formal I&D.  Procedure note: Cleaned skin with alcohol, applied topical freezing spray, made a single stab incision with sterile 18-gauge needle, expressed a small amount of blood and pus.  Sent this off for culture.  Applied bacitracin and dressing.  Patient tolerated procedure well.  Home with doxycycline, Tylenol/ibuprofen combination, chlorhexidine soap.  Follow-up with PMD in 3 days, to ER if he  gets worse.   Discussed labs, imaging, MDM, treatment plan, and plan for follow-up with patient. Discussed sn/sx that should prompt return to the ED. patient agrees with plan.   Meds ordered this encounter  Medications  . doxycycline (VIBRAMYCIN) 100 MG capsule    Sig: Take 1 capsule (100 mg total) by mouth 2 (two) times daily for 5 days.    Dispense:  10 capsule    Refill:  0  . chlorhexidine (HIBICLENS) 4 % external liquid    Sig: Apply topically daily as needed. Dilute 10-15 mL in water, Use daily when bathing for 1-2 weeks     Dispense:  120 mL    Refill:  0  . ibuprofen (ADVIL) 600 MG tablet    Sig: Take 1 tablet (600 mg total) by mouth every 6 (six) hours as needed.    Dispense:  30 tablet    Refill:  0    *This clinic note was created using Scientist, clinical (histocompatibility and immunogenetics). Therefore, there may be occasional mistakes despite careful proofreading.   ?    Domenick Gong, MD 05/18/19 1012

## 2019-05-20 LAB — AEROBIC CULTURE W GRAM STAIN (SUPERFICIAL SPECIMEN): Gram Stain: NONE SEEN

## 2019-08-12 ENCOUNTER — Ambulatory Visit: Payer: Self-pay | Attending: Internal Medicine

## 2019-08-12 DIAGNOSIS — Z23 Encounter for immunization: Secondary | ICD-10-CM

## 2019-08-12 NOTE — Progress Notes (Signed)
   Covid-19 Vaccination Clinic  Name:  Calum Cormier    MRN: [id number] DOB: Oct 02, 1960  08/12/2019  Mr. Moten was observed post Covid-19 immunization for 15 minutes without incident. He was provided with Vaccine Information Sheet and instruction to access the V-Safe system.   Mr. Wenner was instructed to call 911 with any severe reactions post vaccine: Marland Kitchen Difficulty breathing  . Swelling of face and throat  . A fast heartbeat  . A bad rash all over body  . Dizziness and weakness   Immunizations Administered    Name Date Dose VIS Date Route   Pfizer COVID-19 Vaccine 08/12/2019 10:43 AM 0.3 mL 04/21/2019 Intramuscular   Manufacturer: [employer], [employer]   Lot: [id number]   NDC: [address]-7

## 2019-09-06 ENCOUNTER — Ambulatory Visit: Payer: Self-pay | Attending: Internal Medicine

## 2019-09-06 DIAGNOSIS — Z23 Encounter for immunization: Secondary | ICD-10-CM

## 2019-09-06 NOTE — Progress Notes (Signed)
   Covid-19 Vaccination Clinic  Name:  Daniel Coleman    MRN: [id number] DOB: 1960/05/28  09/06/2019  [name] was observed post Covid-19 immunization for 15 minutes without incident. He was provided with Vaccine Information Sheet and instruction to access the V-Safe system.   [name] was instructed to call 911 with any severe reactions post vaccine: Marland Kitchen Difficulty breathing  . Swelling of face and throat  . A fast heartbeat  . A bad rash all over body  . Dizziness and weakness   Immunizations Administered    Name Date Dose VIS Date Route   Pfizer COVID-19 Vaccine 09/06/2019  3:52 PM 0.3 mL 07/05/2018 Intramuscular   Manufacturer: [employer], [employer]   Lot: [id number]   NDC: [address]-3

## 2020-04-08 ENCOUNTER — Encounter: Payer: Self-pay | Admitting: Emergency Medicine

## 2020-04-08 ENCOUNTER — Other Ambulatory Visit: Payer: Self-pay

## 2020-04-08 ENCOUNTER — Ambulatory Visit
Admission: EM | Admit: 2020-04-08 | Discharge: 2020-04-08 | Disposition: A | Discharge status: Home / Self Care | Payer: BC Managed Care – PPO | Attending: Emergency Medicine | Admitting: Emergency Medicine

## 2020-04-08 DIAGNOSIS — L293 Anogenital pruritus, unspecified: Secondary | ICD-10-CM

## 2020-04-08 MED ORDER — CLOTRIMAZOLE-BETAMETHASONE 1-0.05 % EX CREA: TOPICAL_CREAM | CUTANEOUS | 0 refills | Status: DC

## 2020-04-08 NOTE — ED Provider Notes (Signed)
HPI  SUBJECTIVE:  Daniel Coleman is a 59 y.o. male who presents with intense groin and perineal itching starting yesterday.  He has had identical symptoms previously and was treated with clotrimazole 1%/betamethasone 0.05% which he states improved his symptoms.  No rectal itching.  No urinary complaints, penile discharge, testicular or scrotal pain, swelling, erythema.  No new lotions, soaps, detergents.  He is in a long-term monogamous relationship with his wife who is asymptomatic.  Last intercourse was last night.  No aggravating or alleviating factors.  He has not tried anything for this.  Past medical history negative for diabetes.  PMD: Dr. Zada Finders.  History reviewed. No pertinent past medical history.  Past Surgical History:  Procedure Laterality Date  . APPENDECTOMY      Family History  Problem Relation Age of Onset  . Healthy Mother   . Diabetes Father     Social History   Tobacco Use  . Smoking status: Former Games developer  . Smokeless tobacco: Former Clinical biochemist  . Vaping Use: Never used  Substance Use Topics  . Alcohol use: No  . Drug use: No    No current facility-administered medications for this encounter.  Current Outpatient Medications:  .  clotrimazole-betamethasone (LOTRISONE) cream, Apply to affected area 2 times daily prn, Disp: 15 g, Rfl: 0  No Known Allergies   ROS  As noted in HPI.   Physical Exam  BP (!) 114/92 (BP Location: Right Arm)   Pulse 60   Temp 98.2 F (36.8 C) (Oral)   Resp 18   Ht 5\' 9"  (1.753 m)   Wt 83.9 kg   SpO2 100%   BMI 27.31 kg/m   Constitutional: Well developed, well nourished, no acute distress Eyes:  EOMI, conjunctiva normal bilaterally HENT: Normocephalic, atraumatic,mucus membranes moist Respiratory: Normal inspiratory effort Cardiovascular: Normal rate GI: nondistended Lymph: No inguinal lymphadenopathy GU: Normal uncircumcised male.  Shaved pubis.  No penile rash.  No rash in the groin, perineum, testicles  where the patient states he itches.  No testicular, epididymal tenderness.  No scrotal erythema, edema.  No pustules, excoriations, blisters or ulcers.  Declined chaperone Musculoskeletal: no deformities Neurologic: Alert & oriented x 3, no focal neuro deficits Psychiatric: Speech and behavior appropriate   ED Course   Medications - No data to display  No orders of the defined types were placed in this encounter.   No results found for this or any previous visit (from the past 24 hour(s)). No results found.  ED Clinical Impression  1. Itching of male genitalia      ED Assessment/Plan  Patient  denies anal itching.  States that it is all around his testicles and perineum.  Could be an early fungal infection.  Patient states clotrimazole 1% betamethasone 0.05% cream has worked well for him in the past and he has had identical symptoms, so we will refill this.  He states he has no history of diabetes, does not appear to be pubic lice, herpes, STD.  He has follow-up with his primary care physician tomorrow.    Meds ordered this encounter  Medications  . clotrimazole-betamethasone (LOTRISONE) cream    Sig: Apply to affected area 2 times daily prn    Dispense:  15 g    Refill:  0    *This clinic note was created using . Therefore, there may be occasional mistakes despite careful proofreading.   ?    Scientist, clinical (histocompatibility and immunogenetics), MD 04/08/20 2010

## 2020-04-08 NOTE — ED Triage Notes (Signed)
Patient c/o itching in his rectal area x 3 days

## 2020-04-08 NOTE — Discharge Instructions (Addendum)
Apply the cream twice a day as needed.  Do not use any perfumed soaps or body washes in this area.  Refrain from intercourse until your symptoms have resolved.

## 2020-07-07 ENCOUNTER — Ambulatory Visit (INDEPENDENT_AMBULATORY_CARE_PROVIDER_SITE_OTHER): Payer: BC Managed Care – PPO

## 2020-07-07 ENCOUNTER — Other Ambulatory Visit: Payer: Self-pay

## 2020-07-07 ENCOUNTER — Ambulatory Visit
Admission: EM | Admit: 2020-07-07 | Discharge: 2020-07-07 | Disposition: A | Discharge status: Home / Self Care | Payer: BC Managed Care – PPO | Attending: Family Medicine | Admitting: Family Medicine

## 2020-07-07 DIAGNOSIS — R1030 Lower abdominal pain, unspecified: Secondary | ICD-10-CM | POA: Insufficient documentation

## 2020-07-07 DIAGNOSIS — M545 Low back pain, unspecified: Secondary | ICD-10-CM | POA: Insufficient documentation

## 2020-07-07 DIAGNOSIS — R109 Unspecified abdominal pain: Secondary | ICD-10-CM | POA: Diagnosis not present

## 2020-07-07 LAB — CBC WITH DIFFERENTIAL/PLATELET
Abs Immature Granulocytes: 0.01 10*3/uL (ref 0.00–0.07)
Basophils Absolute: 0.1 10*3/uL (ref 0.0–0.1)
Basophils Relative: 2 %
Eosinophils Absolute: 0.2 10*3/uL (ref 0.0–0.5)
Eosinophils Relative: 4 %
HCT: 45.2 % (ref 39.0–52.0)
Hemoglobin: 15 g/dL (ref 13.0–17.0)
Immature Granulocytes: 0 %
Lymphocytes Relative: 47 %
Lymphs Abs: 2.2 10*3/uL (ref 0.7–4.0)
MCH: 29.2 pg (ref 26.0–34.0)
MCHC: 33.2 g/dL (ref 30.0–36.0)
MCV: 88.1 fL (ref 80.0–100.0)
Monocytes Absolute: 0.6 10*3/uL (ref 0.1–1.0)
Monocytes Relative: 14 %
Neutro Abs: 1.5 10*3/uL — ABNORMAL LOW (ref 1.7–7.7)
Neutrophils Relative %: 33 %
Platelets: 262 10*3/uL (ref 150–400)
RBC: 5.13 MIL/uL (ref 4.22–5.81)
RDW: 13.4 % (ref 11.5–15.5)
WBC: 4.5 10*3/uL (ref 4.0–10.5)
nRBC: 0 % (ref 0.0–0.2)

## 2020-07-07 LAB — URINALYSIS, COMPLETE (UACMP) WITH MICROSCOPIC
Bacteria, UA: NONE SEEN
Bilirubin Urine: NEGATIVE
Glucose, UA: NEGATIVE mg/dL
Ketones, ur: NEGATIVE mg/dL
Leukocytes,Ua: NEGATIVE
Nitrite: NEGATIVE
Protein, ur: NEGATIVE mg/dL
Specific Gravity, Urine: 1.02 (ref 1.005–1.030)
Squamous Epithelial / HPF: NONE SEEN (ref 0–5)
pH: 7 (ref 5.0–8.0)

## 2020-07-07 LAB — COMPREHENSIVE METABOLIC PANEL
ALT: 22 U/L (ref 0–44)
AST: 18 U/L (ref 15–41)
Albumin: 4.3 g/dL (ref 3.5–5.0)
Alkaline Phosphatase: 68 U/L (ref 38–126)
Anion gap: 7 (ref 5–15)
BUN: 12 mg/dL (ref 6–20)
CO2: 30 mmol/L (ref 22–32)
Calcium: 9.4 mg/dL (ref 8.9–10.3)
Chloride: 100 mmol/L (ref 98–111)
Creatinine, Ser: 1.03 mg/dL (ref 0.61–1.24)
GFR, Estimated: >60 mL/min (ref >60)
Glucose, Bld: 89 mg/dL (ref 70–99)
Potassium: 4.3 mmol/L (ref 3.5–5.1)
Sodium: 137 mmol/L (ref 135–145)
Total Bilirubin: 1 mg/dL (ref 0.3–1.2)
Total Protein: 7.9 g/dL (ref 6.5–8.1)

## 2020-07-07 LAB — LIPASE, BLOOD: Lipase: 29 U/L (ref 11–51)

## 2020-07-07 MED ORDER — MELOXICAM 15 MG PO TABS: 15.0000 mg | ORAL_TABLET | Freq: Every day | ORAL | 0 refills | Status: DC | PRN

## 2020-07-07 MED ORDER — POLYETHYLENE GLYCOL 3350 17 GM/SCOOP PO POWD: ORAL | 0 refills | Status: DC

## 2020-07-07 NOTE — Discharge Instructions (Signed)
Medication as directed.  Labs, urine, and xray reassuring/unremarkable.  Take care  Dr. Adriana Simas

## 2020-07-07 NOTE — ED Provider Notes (Signed)
[facility]    CSN: [id number] Arrival date & time: 07/07/20  1009      History   Chief Complaint Chief Complaint  Patient presents with  . Abdominal Pain  . Back Pain   HPI  60 year old male presents with the above complaints.  Patient reports ongoing left lower back pain and left lower abdominal pain for the past 3 days.  Also reports some numbness of his left arm.  He states that he does a lot of lifting at work and believes that this is the contributing factor.  Denies nausea, vomiting, diarrhea.  No fever.  No reports of chest pain.  No relieving factors.  No other complaints.  Past Surgical History:  Procedure Laterality Date  . APPENDECTOMY     Home Medications    Prior to Admission medications   Medication Sig Start Date End Date Taking? Authorizing Provider  meloxicam (MOBIC) 15 MG tablet Take 1 tablet (15 mg total) by mouth daily as needed for pain. 07/07/20  Yes Muskan Bolla G, DO  polyethylene glycol powder (GLYCOLAX/MIRALAX) 17 GM/SCOOP powder 17 g once or twice daily for constipation. 07/07/20  Yes Tommie Sams, DO  clotrimazole-betamethasone (LOTRISONE) cream Apply to affected area 2 times daily prn 04/08/20   Domenick Gong, MD    Family History Family History  Problem Relation Age of Onset  . Healthy Mother   . Diabetes Father     Social History Social History   Tobacco Use  . Smoking status: Former Games developer  . Smokeless tobacco: Former Clinical biochemist  . Vaping Use: Never used  Substance Use Topics  . Alcohol use: No  . Drug use: No     Allergies   Patient has no known allergies.   Review of Systems Review of Systems Per HPI  Physical Exam Triage Vital Signs ED Triage Vitals  Enc Vitals Group     BP 07/07/20 1022 131/87     Pulse Rate 07/07/20 1022 64     Resp 07/07/20 1022 16     Temp 07/07/20 1022 98 F (36.7 C)     Temp Source 07/07/20 1022 Oral     SpO2 07/07/20 1022 100 %     Weight 07/07/20 1024 170 lb  (77.1 kg)     Height --      Head Circumference --      Peak Flow --      Pain Score 07/07/20 1020 5     Pain Loc --      Pain Edu? --      Excl. in GC? --    Updated Vital Signs BP 131/87 (BP Location: Left Arm)   Pulse 64   Temp 98 F (36.7 C) (Oral)   Resp 16   Wt 77.1 kg   SpO2 100%   BMI 25.10 kg/m   Visual Acuity Right Eye Distance:   Left Eye Distance:   Bilateral Distance:    Right Eye Near:   Left Eye Near:    Bilateral Near:     Physical Exam Vitals and nursing note reviewed.  Constitutional:      General: He is not in acute distress.    Appearance: He is well-developed. He is not ill-appearing.  HENT:     Head: Normocephalic and atraumatic.  Eyes:     General:        Right eye: No discharge.        Left eye: No discharge.  Conjunctiva/sclera: Conjunctivae normal.  Cardiovascular:     Rate and Rhythm: Normal rate and regular rhythm.     Heart sounds: No murmur heard.   Pulmonary:     Effort: Pulmonary effort is normal.     Breath sounds: Normal breath sounds. No wheezing, rhonchi or rales.  Abdominal:     General: There is no distension.     Palpations: Abdomen is soft.     Tenderness: There is no abdominal tenderness.  Neurological:     Mental Status: He is alert.  Psychiatric:        Mood and Affect: Mood normal.        Behavior: Behavior normal.    UC Treatments / Results  Labs (all labs ordered are listed, but only abnormal results are displayed) Labs Reviewed  CBC WITH DIFFERENTIAL/PLATELET - Abnormal; Notable for the following components:      Result Value   Neutro Abs 1.5 (*)    All other components within normal limits  URINALYSIS, COMPLETE (UACMP) WITH MICROSCOPIC - Abnormal; Notable for the following components:   Hgb urine dipstick TRACE (*)    All other components within normal limits  COMPREHENSIVE METABOLIC PANEL  LIPASE, BLOOD    EKG   Radiology DG Abd 1 View  Result Date: 07/07/2020 CLINICAL DATA:  Lower  abdominal pain and left lower back pain x3 days. EXAM: ABDOMEN - 1 VIEW COMPARISON:  None. FINDINGS: The bowel gas pattern is normal. Moderate volume of formed stool throughout the colon. No acute osseous abnormality. IMPRESSION: Nonobstructive bowel gas pattern. Electronically Signed   By: Maudry Mayhew MD   On: 07/07/2020 11:18    Procedures Procedures (including critical care time)  Medications Ordered in UC Medications - No data to display  Initial Impression / Assessment and Plan / UC Course  I have reviewed the triage vital signs and the nursing notes.  Pertinent labs & imaging results that were available during my care of the patient were reviewed by me and considered in my medical decision making (see chart for details).    60 year old male presents with lower abdominal pain and low back pain.  His exam is benign.  He is well-appearing.  Laboratory studies normal.  Urinalysis unremarkable.  KUB with moderate stool.  MiraLAX as needed for constipation.  Mobic as needed for pain.  Work note given.  Final Clinical Impressions(s) / UC Diagnoses   Final diagnoses:  Lower abdominal pain  Acute left-sided low back pain without sciatica     Discharge Instructions     Medication as directed.  Labs, urine, and xray reassuring/unremarkable.  Take care  Dr. Adriana Simas    ED Prescriptions    Medication Sig Dispense Auth. Provider   meloxicam (MOBIC) 15 MG tablet Take 1 tablet (15 mg total) by mouth daily as needed for pain. 30 tablet Armanii Pressnell G, DO   polyethylene glycol powder (GLYCOLAX/MIRALAX) 17 GM/SCOOP powder 17 g once or twice daily for constipation. 500 g Tommie Sams, DO     PDMP not reviewed this encounter.   Tommie Sams, DO 07/07/20 1330

## 2020-07-07 NOTE — ED Triage Notes (Signed)
Pt here for lower abdominal pain and left lower back pain x 3 days. Pt believes it is related to his work he does a lot of lifting. Pt states he is treating discomfort with Protonix.  Denies fever.

## 2020-08-04 ENCOUNTER — Other Ambulatory Visit: Payer: Self-pay | Admitting: Family Medicine

## 2021-06-24 ENCOUNTER — Other Ambulatory Visit: Payer: Self-pay

## 2021-06-24 ENCOUNTER — Ambulatory Visit
Admission: EM | Admit: 2021-06-24 | Discharge: 2021-06-24 | Disposition: A | Discharge status: Home / Self Care | Payer: BC Managed Care – PPO | Attending: Emergency Medicine | Admitting: Emergency Medicine

## 2021-06-24 DIAGNOSIS — S39012A Strain of muscle, fascia and tendon of lower back, initial encounter: Secondary | ICD-10-CM

## 2021-06-24 MED ORDER — IBUPROFEN 600 MG PO TABS: 600.0000 mg | ORAL_TABLET | Freq: Four times a day (QID) | ORAL | 0 refills | Status: DC | PRN

## 2021-06-24 MED ORDER — TIZANIDINE HCL 4 MG PO TABS: 4.0000 mg | ORAL_TABLET | Freq: Three times a day (TID) | ORAL | 0 refills | Status: DC | PRN

## 2021-06-24 NOTE — ED Provider Notes (Signed)
HPI  SUBJECTIVE:  Daniel Coleman is a 61 y.o. male who presents with constant right lower back pain after bending over to pick something up.  He states that he felt something pull and has had pain since.  No direct trauma to the area, saddle anesthesia, bilateral radicular pain, leg weakness, urinary or fecal incontinence, urinary retention.  No previous history of back injury.  Symptoms worse with sitting down for prolonged periods of time, leaning to the right.  No alleviating factors.  He has not tried anything for this.  He has no past medical history.  PMD: [facility]  History reviewed. No pertinent past medical history.  Past Surgical History:  Procedure Laterality Date   APPENDECTOMY      Family History  Problem Relation Age of Onset   Healthy Mother    Diabetes Father     Social History   Tobacco Use   Smoking status: Former   Smokeless tobacco: Former  Building services engineer Use: Never used  Substance Use Topics   Alcohol use: No   Drug use: No    No current facility-administered medications for this encounter.  Current Outpatient Medications:    ibuprofen (ADVIL) 600 MG tablet, Take 1 tablet (600 mg total) by mouth every 6 (six) hours as needed., Disp: 30 tablet, Rfl: 0   tiZANidine (ZANAFLEX) 4 MG tablet, Take 1 tablet (4 mg total) by mouth every 8 (eight) hours as needed for muscle spasms., Disp: 30 tablet, Rfl: 0  No Known Allergies   ROS  As noted in HPI.   Physical Exam  BP 123/85 (BP Location: Left Arm)    Pulse 65    Temp 97.9 F (36.6 C) (Oral)    Resp 18    Ht 5\' 11"  (1.803 m)    Wt 80.7 kg    SpO2 100%    BMI 24.83 kg/m   Constitutional: Well developed, well nourished, no acute distress Eyes:  EOMI, conjunctiva normal bilaterally HENT: Normocephalic, atraumatic,mucus membranes moist Respiratory: Normal inspiratory effort Cardiovascular: Normal rate GI: nondistended. No suprapubic tenderness skin: No rash, skin intact Musculoskeletal: no  CVAT.  Positive right paralumbar, QL tenderness, positive muscle spasm. No L-spine, SI joint, sacral bony tenderness. Bilateral lower extremities nontender, baseline ROM with intact  PT pulses, pain aggravated with right hip flexion against resistance.  Otherwise no pain with passive int/ext rotation, flex/extension hips, AB/AD duction bilaterally. SLR neg bilaterally. Sensation baseline light touch bilaterally for Pt, DTR's symmetric and intact bilaterally KJ.  Motor symmetric bilateral 5/5 hip flexion, quadriceps, hamstrings, EHL, foot dorsiflexion, foot plantarflexion Neurologic: Alert & oriented x 3, no focal neuro deficits Psychiatric: Speech and behavior appropriate   ED Course   Medications - No data to display  No orders of the defined types were placed in this encounter.   No results found for this or any previous visit (from the past 24 hour(s)). No results found.  ED Clinical Impression  1. Strain of lumbar region, initial encounter     ED Assessment/Plan  No historical red flags as noted in HPI. No physical red flags such as fever, bony tenderness, lower extremity weakness, saddle anesthesia. Imaging not indicated at this time.   Presentation consistent with lumbar strain.  Home with ibuprofen/Tylenol, Zanaflex, heat, deep tissue massage, gentle stretching , follow-up with PMD as needed.  Discussed  MDM, treatment plan, and plan for follow-up with patient. . patient agrees with plan.   Meds ordered this encounter  Medications  tiZANidine (ZANAFLEX) 4 MG tablet    Sig: Take 1 tablet (4 mg total) by mouth every 8 (eight) hours as needed for muscle spasms.    Dispense:  30 tablet    Refill:  0   ibuprofen (ADVIL) 600 MG tablet    Sig: Take 1 tablet (600 mg total) by mouth every 6 (six) hours as needed.    Dispense:  30 tablet    Refill:  0    *This clinic note was created using Scientist, clinical (histocompatibility and immunogenetics). Therefore, there may be occasional mistakes despite careful  proofreading.  ?     Domenick Gong, MD 06/24/21 [phone]

## 2021-06-24 NOTE — Discharge Instructions (Signed)
Take 1000 mg of Tylenol combined with 600 mg ibuprofen 3-4 times a day.  Zanaflex will help with muscle spasms.  Many people find heat, gentle stretching and deep tissue massage helpful.   Go to www.goodrx.com  or www.costplusdrugs.com to look up your medications. This will give you a list of where you can find your prescriptions at the most affordable prices. Or ask the pharmacist what the cash price is, or if they have any other discount programs available to help make your medication more affordable. This can be less expensive than what you would pay with insurance.

## 2021-06-24 NOTE — ED Triage Notes (Signed)
Pt c/o back pain x3days. Pt states that he was bent over on Sunday afternoon and when he came up started to have pain in the right side.

## 2021-07-05 ENCOUNTER — Encounter: Payer: Self-pay | Admitting: Emergency Medicine

## 2021-07-05 ENCOUNTER — Other Ambulatory Visit: Payer: Self-pay

## 2021-07-05 ENCOUNTER — Ambulatory Visit
Admission: EM | Admit: 2021-07-05 | Discharge: 2021-07-05 | Disposition: A | Discharge status: Home / Self Care | Payer: BC Managed Care – PPO | Attending: Student | Admitting: Student

## 2021-07-05 DIAGNOSIS — A084 Viral intestinal infection, unspecified: Secondary | ICD-10-CM

## 2021-07-05 DIAGNOSIS — Z0289 Encounter for other administrative examinations: Secondary | ICD-10-CM | POA: Diagnosis not present

## 2021-07-05 DIAGNOSIS — J302 Other seasonal allergic rhinitis: Secondary | ICD-10-CM | POA: Insufficient documentation

## 2021-07-05 MED ORDER — ONDANSETRON 8 MG PO TBDP: 8.0000 mg | ORAL_TABLET | Freq: Three times a day (TID) | ORAL | 0 refills | Status: DC | PRN

## 2021-07-05 MED ORDER — LOPERAMIDE HCL 2 MG PO CAPS: 2.0000 mg | ORAL_CAPSULE | Freq: Four times a day (QID) | ORAL | 0 refills | Status: DC | PRN

## 2021-07-05 NOTE — Discharge Instructions (Addendum)
-  Take the Zofran (ondansetron) up to 3 times daily for nausea and vomiting. Dissolve one pill under your tongue or between your teeth and your cheek. -Take the Imodium (loperamide) up to 4 times daily for diarrhea. -Drink plenty of fluids  

## 2021-07-05 NOTE — ED Provider Notes (Signed)
[facility]    CSN: [id number] Arrival date & time: 07/05/21  0815      History   Chief Complaint Chief Complaint  Patient presents with   Emesis   Diarrhea    HPI Daniel Coleman is a 61 y.o. male presenting with 24 hours of stomach bug.  History noncontributory.  States that first his daughter had symptoms, and now he feels like he is sick too.  Describes about 5 episodes of bilious vomiting in the last 24 hours, and about the same of watery diarrhea.  Generalized crampy abdominal pain without focal component. Denies fevers/chills, shortness of breath, cough.  Has not attempted medications at home. Tolerating gatorade.   HPI  History reviewed. No pertinent past medical history.  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 Authorizing Provider  loperamide (IMODIUM) 2 MG capsule Take 1 capsule (2 mg total) by mouth 4 (four) times daily as needed for diarrhea or loose stools. 07/05/21  Yes Rhys Martini, PA-C  ondansetron (ZOFRAN-ODT) 8 MG disintegrating tablet Take 1 tablet (8 mg total) by mouth every 8 (eight) hours as needed for nausea or vomiting. 07/05/21  Yes Rhys Martini, PA-C  ibuprofen (ADVIL) 600 MG tablet Take 1 tablet (600 mg total) by mouth every 6 (six) hours as needed. 06/24/21   Domenick Gong, MD    Family History Family History  Problem Relation Age of Onset   Healthy Mother    Diabetes Father     Social History Social History   Tobacco Use   Smoking status: Former   Smokeless tobacco: Former  Building services engineer Use: Never used  Substance Use Topics   Alcohol use: No   Drug use: No     Allergies    Patient has no known allergies.   Review of Systems Review of Systems  Constitutional:  Negative for appetite change, chills, diaphoresis, fever and unexpected weight change.  HENT:  Negative for congestion, ear pain, sinus pressure, sinus pain, sneezing, sore throat and trouble swallowing.   Respiratory:  Negative for cough, chest tightness and shortness of breath.   Cardiovascular:  Negative for chest pain.  Gastrointestinal:  Positive for diarrhea, nausea and vomiting. Negative for abdominal distention, abdominal pain, anal bleeding, blood in stool, constipation and rectal pain.  Genitourinary:  Negative for dysuria, flank pain, frequency and urgency.  Musculoskeletal:  Negative for back pain and myalgias.  Neurological:  Negative for dizziness, light-headedness and headaches.  All other systems reviewed and are negative.   Physical Exam Triage Vital Signs ED Triage Vitals  Enc Vitals Group     BP 07/05/21 0828 122/83     Pulse Rate 07/05/21 0828 60     Resp 07/05/21 0828 15     Temp 07/05/21 0828 97.8 F (36.6 C)     Temp Source 07/05/21 0828 Oral     SpO2 07/05/21 0828 100 %     Weight 07/05/21 0825 177 lb 14.6 oz (80.7 kg)     Height 07/05/21 0825 5\' 11"  (1.803 m)     Head Circumference --  Peak Flow --      Pain Score 07/05/21 0825 0     Pain Loc --      Pain Edu? --      Excl. in GC? --    No data found.  Updated Vital Signs BP 122/83 (BP Location: Left Arm)    Pulse 60    Temp 97.8 F (36.6 C) (Oral)    Resp 15    Ht 5\' 11"  (1.803 m)    Wt 177 lb 14.6 oz (80.7 kg)    SpO2 100%    BMI 24.81 kg/m   Visual Acuity Right Eye Distance:   Left Eye Distance:   Bilateral Distance:    Right Eye Near:   Left Eye Near:    Bilateral Near:     Physical Exam Vitals reviewed.  Constitutional:      General: He is not in acute distress.    Appearance: Normal appearance. He is not ill-appearing.  HENT:     Head: Normocephalic and atraumatic.     Mouth/Throat:      Mouth: Mucous membranes are moist.     Comments: Moist mucous membranes Eyes:     Extraocular Movements: Extraocular movements intact.     Pupils: Pupils are equal, round, and reactive to light.  Cardiovascular:     Rate and Rhythm: Normal rate and regular rhythm.     Heart sounds: Normal heart sounds.  Pulmonary:     Effort: Pulmonary effort is normal.     Breath sounds: Normal breath sounds. No wheezing, rhonchi or rales.  Abdominal:     General: Bowel sounds are normal. There is no distension.     Palpations: Abdomen is soft. There is no mass.     Tenderness: There is no abdominal tenderness. There is no right CVA tenderness, left CVA tenderness, guarding or rebound.     Comments: No reproducible pain   Skin:    General: Skin is warm.     Capillary Refill: Capillary refill takes less than 2 seconds.     Comments: Good skin turgor  Neurological:     General: No focal deficit present.     Mental Status: He is alert and oriented to person, place, and time.  Psychiatric:        Mood and Affect: Mood normal.        Behavior: Behavior normal.     UC Treatments / Results  Labs (all labs ordered are listed, but only abnormal results are displayed) Labs Reviewed - No data to display  EKG   Radiology No results found.  Procedures Procedures (including critical care time)  Medications Ordered in UC Medications - No data to display  Initial Impression / Assessment and Plan / UC Course  I have reviewed the triage vital signs and the nursing notes.  Pertinent labs & imaging results that were available during my care of the patient were reviewed by me and considered in my medical decision making (see chart for details).     This patient is a very pleasant 61 y.o. year old male presenting with viral gastroenteritis. Afebrile, nontachy. Appears fairly well hydrated. Zofran ODT, imodium sent. Work note provided. BRAT diet, good hydration. ED return precautions discussed.  Patient verbalizes understanding and agreement.  .   Final Clinical Impressions(s) / UC Diagnoses   Final diagnoses:  Viral gastroenteritis  Encounter to obtain excuse from work     Discharge Instructions      -Take the Zofran (ondansetron) up  to 3 times daily for nausea and vomiting. Dissolve one pill under your tongue or between your teeth and your cheek. -Take the Imodium (loperamide) up to 4 times daily for diarrhea. -Drink plenty of fluids   ED Prescriptions     Medication Sig Dispense Auth. Provider   ondansetron (ZOFRAN-ODT) 8 MG disintegrating tablet Take 1 tablet (8 mg total) by mouth every 8 (eight) hours as needed for nausea or vomiting. 20 tablet Rhys Martini, PA-C   loperamide (IMODIUM) 2 MG capsule Take 1 capsule (2 mg total) by mouth 4 (four) times daily as needed for diarrhea or loose stools. 12 capsule Rhys Martini, PA-C      PDMP not reviewed this encounter.   Rhys Martini, PA-C 07/05/21 [phone]

## 2021-07-05 NOTE — ED Triage Notes (Addendum)
Patient c/o V/D that started yesterday.  Patient reports some pain at the back of his head.  Patient denies injury.  Patient states that he is exhausted from work.  Patient denies any other cold symptoms or fevers. Patient states that his daughter had similar symptoms prior.

## 2021-09-20 ENCOUNTER — Ambulatory Visit (INDEPENDENT_AMBULATORY_CARE_PROVIDER_SITE_OTHER): Payer: BC Managed Care – PPO

## 2021-09-20 ENCOUNTER — Other Ambulatory Visit: Payer: Self-pay

## 2021-09-20 ENCOUNTER — Ambulatory Visit
Admission: EM | Admit: 2021-09-20 | Discharge: 2021-09-20 | Disposition: A | Discharge status: Home / Self Care | Payer: BC Managed Care – PPO | Attending: Internal Medicine | Admitting: Internal Medicine

## 2021-09-20 DIAGNOSIS — R1010 Upper abdominal pain, unspecified: Secondary | ICD-10-CM

## 2021-09-20 DIAGNOSIS — K5900 Constipation, unspecified: Secondary | ICD-10-CM

## 2021-09-20 DIAGNOSIS — R140 Abdominal distension (gaseous): Secondary | ICD-10-CM

## 2021-09-20 MED ORDER — ONDANSETRON 8 MG PO TBDP
8.0000 mg | ORAL_TABLET | Freq: Once | ORAL | Status: AC
  Administered 2021-09-20: 8 mg via ORAL

## 2021-09-20 MED ORDER — LIDOCAINE VISCOUS HCL 2 % MT SOLN
15.0000 mL | Freq: Once | OROMUCOSAL | Status: AC
  Administered 2021-09-20: 15 mL via ORAL

## 2021-09-20 MED ORDER — ALUM & MAG HYDROXIDE-SIMETH 200-200-20 MG/5ML PO SUSP
30.0000 mL | Freq: Once | ORAL | Status: AC
  Administered 2021-09-20: 30 mL via ORAL

## 2021-09-20 NOTE — ED Provider Notes (Signed)
?[facility] ? ? ? ?CSN: [id number] ?Arrival date & time: 09/20/21  1543 ? ? ?  ? ?History   ?Chief Complaint ?Chief Complaint  ?Patient presents with  ? Abdominal Pain  ? ? ?HPI ?Daniel Coleman is a 61 y.o. male. who started having upper abdominal pain around 11 am with feeling bloated and gassy. He ate cheese and bread for breakfast. Ate rice for lunch. Gets intermittent sharp chest pain that last a few seconds on both his lower ribs. Denies fever, chills, cough. Has been nauseous and dry heaved, but did not vomit, and his stomach has been gurgling. Has done this before where he got bloated but did not have pain. His last colonoscopy was 2 years ago.  ? ? ? ?History reviewed. No pertinent past medical history. ? ?Patient Active Problem List  ? Diagnosis Date Noted  ? Seasonal allergies 07/05/2021  ? Skin irritation 05/06/2017  ? Cough 05/06/2017  ? 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 Kidney stones 04/22/2015  ? ? ?Past Surgical History:  ?Procedure Laterality Date  ? APPENDECTOMY    ? ? ? ?Home Medications   ? ?Prior to Admission medications   ?Medication Sig Start Date End Date Taking? Authorizing Provider  ?ibuprofen (ADVIL) 600 MG tablet Take 1 tablet (600 mg total) by mouth every 6 (six) hours as needed. 06/24/21  Yes Domenick Gong, MD  ?loperamide (IMODIUM) 2 MG capsule Take 1 capsule (2 mg total) by mouth 4 (four) times daily as needed for diarrhea or loose stools. 07/05/21  Yes Rhys Martini, PA-C  ?ondansetron (ZOFRAN-ODT) 8 MG disintegrating tablet Take 1 tablet (8 mg total) by mouth every 8 (eight) hours as needed for nausea or vomiting. 07/05/21  Yes Rhys Martini, PA-C  ? ? ?Family History ?Family History  ?Problem Relation Age of Onset  ? Healthy Mother   ? Diabetes Father   ? ? ?Social History ?Social History  ? ?Tobacco Use  ? Smoking status: Former  ? Smokeless tobacco: Former  ?Vaping Use  ? Vaping Use: Never used  ?Substance Use Topics  ? Alcohol use: No  ? Drug use: No  ? ? ? ?Allergies    ?Patient has no known allergies. ? ? ?Review of Systems ?Review of Systems  ?Constitutional:  Negative for appetite change, chills, fatigue and fever.  ?Gastrointestinal:  Positive for abdominal distention, abdominal pain and nausea. Negative for constipation, diarrhea and vomiting.  ?Musculoskeletal:  Negative for gait problem and myalgias.  ?Skin:  Negative for rash.  ?Hematological:  Negative for adenopathy.  ? ? ?Physical Exam ?Triage Vital Signs ?ED Triage Vitals  ?Enc Vitals Group  ?   BP 09/20/21 1603 112/74  ?   Pulse Rate 09/20/21 1603 63  ?   Resp 09/20/21 1603 18  ?   Temp 09/20/21 1603 98.3 ?F (36.8 ?C)  ?   Temp Source 09/20/21 1603 Oral  ?   SpO2 09/20/21 1603 99 %  ?   Weight 09/20/21 1601 175 lb (79.4 kg)  ?   Height 09/20/21 1601 5\' 11"  (1.803 m)  ?   Head Circumference --   ?   Peak Flow --   ?   Pain Score 09/20/21 1600 7  ?   Pain Loc --   ?   Pain Edu? --   ?   Excl. in GC? --   ? ?No data found. ? ?Updated Vital Signs ?BP 112/74 (BP Location: Left Arm)   Pulse 63  Temp 98.3 ?F (36.8 ?C) (Oral)   Resp 18   Ht 5\' 11"  (1.803 m)   Wt 175 lb (79.4 kg)   SpO2 99%   BMI 24.41 kg/m?  ? ?Visual Acuity ?Right Eye Distance:   ?Left Eye Distance:   ?Bilateral Distance:   ? ?Right Eye Near:   ?Left Eye Near:    ?Bilateral Near:    ? ?Physical Exam ?Vitals and nursing note reviewed.  ?Constitutional:   ?   General: He is not in acute distress. ?Cardiovascular:  ?   Rate and Rhythm: Normal rate and regular rhythm.  ?   Heart sounds: No murmur heard. ?Pulmonary:  ?   Effort: Pulmonary effort is normal.  ?   Breath sounds: Normal breath sounds.  ?Chest:  ?   Chest wall: No tenderness.  ?Abdominal:  ?   General: Bowel sounds are increased. There is distension.  ?   Palpations: Abdomen is soft. There is no hepatomegaly or splenomegaly.  ?   Tenderness: There is no abdominal tenderness.  ?Neurological:  ?   Mental Status: He is alert and oriented to person, place, and time.  ?Psychiatric:     ?   Mood and  Affect: Mood normal.     ?   Behavior: Behavior normal.  ? ? ? ?UC Treatments / Results  ?Labs ?(all labs ordered are listed, but only abnormal results are displayed) ?Labs Reviewed - No data to display ? ?EKG ? ? ?Radiology ?DG Abd 1 View ? ?Result Date: 09/20/2021 ?CLINICAL DATA:  Abdominal pain and bloating, constipation EXAM: ABDOMEN - 1 VIEW COMPARISON:  07/07/2020 FINDINGS: Gas-filled, nondistended loops of bowel scattered throughout the abdomen. Large burden of stool in the right hemicolon. Bowel suture in the right lower quadrant. No radio-opaque calculi or other significant radiographic abnormality are seen. IMPRESSION: 1. Gas-filled, nondistended loops of bowel scattered throughout the abdomen. 2. Large burden of stool in the right hemicolon. Electronically Signed   By: Jearld LeschAlex D Bibbey M.D.   On: 09/20/2021 17:01   ? ?Procedures ?Procedures (including critical care time) ? ?Medications Ordered in UC ?Medications  ?ondansetron (ZOFRAN-ODT) disintegrating tablet 8 mg (8 mg Oral Given 09/20/21 1625)  ?alum & mag hydroxide-simeth (MAALOX/MYLANTA) 200-200-20 MG/5ML suspension 30 mL (30 mLs Oral Given 09/20/21 1625)  ?  And  ?lidocaine (XYLOCAINE) 2 % viscous mouth solution 15 mL (15 mLs Oral Given 09/20/21 1625)  ? ? ?Initial Impression / Assessment and Plan / UC Course  ?I have reviewed the triage vital signs and the nursing notes. ? ?Pertinent  imaging results that were available during my care of the patient were reviewed by me and considered in my medical decision making (see chart for details). ? ?He was given Zofran ODT 8 mg and GI cocktail here, and his discomfort resolved.  ?Bloating secondary to constipation ? ?Advised to get Mag Citrate bottle and drink it all to empty his colon. ?See instructions.  ? ?Final Clinical Impressions(s) / UC Diagnoses  ? ?Final diagnoses:  ?Constipation, unspecified constipation type  ? ? ? ?Discharge Instructions   ? ?  ?You have a lot of stool in your right colon, and gas on  the left ?Get Magnesium Citrate and drink the whole bottle to empty your colon ?To prevent constipation in the future, drink 4-8 oz  of prune juice every morning ?Pay attention what foods make you get bloated, if it seems to be more of breads, pasta, could be you have a gluten intolerance.   ? ? ? ? ?  ED Prescriptions   ?None ?  ? ?PDMP not reviewed this encounter. ?  Garey Ham, PA-C ?09/21/21 [id number] ? ?

## 2021-09-20 NOTE — Discharge Instructions (Signed)
You have a lot of stool in your right colon, and gas on the left ?Get Magnesium Citrate and drink the whole bottle to empty your colon ?To prevent constipation in the future, drink 4-8 oz  of prune juice every morning ?Pay attention what foods make you get bloated, if it seems to be more of breads, pasta, could be you have a gluten intolerance.   ?

## 2021-09-20 NOTE — ED Triage Notes (Addendum)
Pt c/o abdominal pain x3days ? ?Pt states that he felt a "blow" in his stomach and now is having pain.  ? ?Pt states that the pain is moving down the middle of his stomach. ? ?Pt hears a rumbling noise along his left side of his stomach.  ? ?Pt last used the restroom yesterday after drinking Tea. Pt states that it was not hard nor diarrhea. ? ?Pt denies any pain when having a bowel movement or urinating.  ?

## 2021-10-27 ENCOUNTER — Other Ambulatory Visit: Payer: Self-pay

## 2021-10-27 ENCOUNTER — Ambulatory Visit
Admission: EM | Admit: 2021-10-27 | Discharge: 2021-10-27 | Disposition: A | Discharge status: Home / Self Care | Payer: BC Managed Care – PPO

## 2021-10-27 ENCOUNTER — Encounter: Payer: Self-pay | Admitting: Internal Medicine

## 2021-10-27 ENCOUNTER — Ambulatory Visit (INDEPENDENT_AMBULATORY_CARE_PROVIDER_SITE_OTHER): Payer: BC Managed Care – PPO

## 2021-10-27 DIAGNOSIS — K5909 Other constipation: Secondary | ICD-10-CM | POA: Diagnosis not present

## 2021-10-27 DIAGNOSIS — R1032 Left lower quadrant pain: Secondary | ICD-10-CM

## 2021-10-27 NOTE — ED Triage Notes (Addendum)
Pt states pain to left sided abdominal pain starting at 1:00 today. Endorses nausea. Pain radiates from left lower quadrant up to left chest. Pt with same pain previously and had renal ultrasound on 10/15/21.

## 2021-10-27 NOTE — Discharge Instructions (Signed)
Get magnesium Citrate and drink the entire bottle until you have empty out your colon To prevent constipation, try drinking 8 oz of prune juice every morning with glass of warm water before eating any food.

## 2021-10-27 NOTE — ED Provider Notes (Addendum)
[facility]    CSN: [id number] Arrival date & time: 10/27/21  1828      History   Chief Complaint Chief Complaint  Patient presents with   Abdominal Pain    HPI Daniel Coleman is a 61 y.o. male who presents with sudden onset of LLQ pain since 1 pm and feels it radiate to his L chest and axilla. Had hard stool today which was hard to pass. Denies fever, but has been having nausea and a lot of gas. He had negative [place] and CT early this month. Saw his PCP 6/8 and was referred to GI whom he has not seen yet, the anointment is in 2 days.     History reviewed. No pertinent past medical history.  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 Authorizing Provider  ibuprofen (ADVIL) 600 MG tablet Take 1 tablet (600 mg total) by mouth every 6 (six) hours as needed. 06/24/21   Domenick Gong, MD  ondansetron (ZOFRAN-ODT) 8 MG disintegrating tablet Take 1 tablet (8 mg total) by mouth every 8 (eight) hours as needed for nausea or vomiting. 07/05/21   Rhys Martini, PA-C  pantoprazole (PROTONIX) 40 MG tablet Take 40 mg by mouth daily. 09/22/21   [provider]    Family History Family History  Problem Relation Age of Onset   Healthy Mother    Diabetes Father     Social History Social History   Tobacco Use   Smoking status: Former   Smokeless tobacco: Former  Building services engineer Use: Never used  Substance Use Topics   Alcohol use: No   Drug use: No     Allergies   Patient has no known allergies.   Review of Systems Review of Systems  Constitutional:  Negative for chills, diaphoresis and fever.  Gastrointestinal:  Positive for  abdominal distention, abdominal pain, constipation and nausea. Negative for diarrhea and vomiting.     Physical Exam Triage Vital Signs ED Triage Vitals  Enc Vitals Group     BP 10/27/21 1847 (!) 127/94     Pulse Rate 10/27/21 1847 79     Resp 10/27/21 1847 18     Temp 10/27/21 1847 98.5 F (36.9 C)     Temp Source 10/27/21 1847 Oral     SpO2 10/27/21 1847 99 %     Weight 10/27/21 1844 174 lb 13.2 oz (79.3 kg)     Height 10/27/21 1844 5\' 11"  (1.803 m)     Head Circumference --      Peak Flow --      Pain Score 10/27/21 1843 9     Pain Loc --      Pain Edu? --      Excl. in GC? --    No data found.  Updated Vital Signs BP (!) 127/94 (BP Location: Left Arm)   Pulse 79   Temp 98.5 F (36.9 C) (Oral)   Resp 18   Ht 5\' 11"  (1.803 m)   Wt 174 lb 13.2 oz (79.3 kg)   SpO2 99%   BMI 24.38 kg/m   Visual Acuity Right  Eye Distance:   Left Eye Distance:   Bilateral Distance:    Right Eye Near:   Left Eye Near:    Bilateral Near:     Physical Exam Vitals and nursing note reviewed.  Constitutional:      General: He is in acute distress.     Appearance: He is not ill-appearing, toxic-appearing or diaphoretic.     Comments: Seems in a lot of pain  Cardiovascular:     Rate and Rhythm: Normal rate and regular rhythm.     Heart sounds: No murmur heard. Pulmonary:     Effort: Pulmonary effort is normal.  Abdominal:     General: Bowel sounds are increased. There is distension.     Palpations: Abdomen is soft.     Tenderness: There is abdominal tenderness in the left lower quadrant. There is no guarding or rebound. Negative signs include psoas sign.  Skin:    General: Skin is warm and dry.     Findings: No rash.  Neurological:     Mental Status: He is alert and oriented to person, place, and time.  Psychiatric:        Mood and Affect: Mood normal.        Behavior: Behavior normal.      UC Treatments / Results  Labs (all labs ordered are listed, but only abnormal  results are displayed) Labs Reviewed - No data to display  EKG   Radiology DG Abd 2 Views  Result Date: 10/27/2021 CLINICAL DATA:  Left lower quadrant pain EXAM: ABDOMEN - 2 VIEW COMPARISON:  Abdominal x-ray 09/20/2021 FINDINGS: The bowel gas pattern is normal. There is no evidence of free air. Likely moderate amount of retained fecal material throughout the colon. Renal shadows are obscured by bowel content. No radio-opaque calculi or other significant radiographic abnormality is seen. IMPRESSION: No acute process identified. Retained fecal material throughout the colon. Electronically Signed   By: Jannifer Hick M.D.   On: 10/27/2021 19:06    Procedures Procedures (including critical care time)  Medications Ordered in UC Medications - No data to display  Initial Impression / Assessment and Plan / UC Course  I have reviewed the triage vital signs and the nursing notes.  Pertinent  imaging results that were available during my care of the patient were reviewed by me and considered in my medical decision making (see chart for details).  LLQ pain Chronic constipation  I advised him to take Mag citrate to empty his colon, and may need to do this for 2 days. Needs to keep apt with GI on Wednesday. See instructions   Final Clinical Impressions(s) / UC Diagnoses   Final diagnoses:  Chronic constipation     Discharge Instructions      Get magnesium Citrate and drink the entire bottle until you have empty out your colon To prevent constipation, try drinking 8 oz of prune juice every morning with glass of warm water before eating any food.       ED Prescriptions   None    PDMP not reviewed this encounter.   Rodriguez-Southworth, Nettie Elm, PA-C 10/27/21 2006    Rodriguez-Southworth, [place], PA-C 10/27/21 2008

## 2021-12-17 ENCOUNTER — Ambulatory Visit
Admission: EM | Admit: 2021-12-17 | Discharge: 2021-12-17 | Disposition: A | Discharge status: Home / Self Care | Payer: BC Managed Care – PPO | Attending: Family Medicine | Admitting: Family Medicine

## 2021-12-17 ENCOUNTER — Other Ambulatory Visit: Payer: Self-pay

## 2021-12-17 ENCOUNTER — Ambulatory Visit (INDEPENDENT_AMBULATORY_CARE_PROVIDER_SITE_OTHER): Payer: BC Managed Care – PPO

## 2021-12-17 ENCOUNTER — Encounter: Payer: Self-pay | Admitting: Emergency Medicine

## 2021-12-17 DIAGNOSIS — R109 Unspecified abdominal pain: Secondary | ICD-10-CM

## 2021-12-17 LAB — CBC WITH DIFFERENTIAL/PLATELET
Abs Immature Granulocytes: 0 10*3/uL (ref 0.00–0.07)
Basophils Absolute: 0.1 10*3/uL (ref 0.0–0.1)
Basophils Relative: 1 %
Eosinophils Absolute: 0.2 10*3/uL (ref 0.0–0.5)
Eosinophils Relative: 4 %
HCT: 39.4 % (ref 39.0–52.0)
Hemoglobin: 13.4 g/dL (ref 13.0–17.0)
Immature Granulocytes: 0 %
Lymphocytes Relative: 52 %
Lymphs Abs: 2.8 10*3/uL (ref 0.7–4.0)
MCH: 29.9 pg (ref 26.0–34.0)
MCHC: 34 g/dL (ref 30.0–36.0)
MCV: 87.9 fL (ref 80.0–100.0)
Monocytes Absolute: 0.6 10*3/uL (ref 0.1–1.0)
Monocytes Relative: 12 %
Neutro Abs: 1.7 10*3/uL (ref 1.7–7.7)
Neutrophils Relative %: 31 %
Platelets: 259 10*3/uL (ref 150–400)
RBC: 4.48 MIL/uL (ref 4.22–5.81)
RDW: 13.6 % (ref 11.5–15.5)
WBC: 5.3 10*3/uL (ref 4.0–10.5)
nRBC: 0 % (ref 0.0–0.2)

## 2021-12-17 LAB — COMPREHENSIVE METABOLIC PANEL
ALT: 21 U/L (ref 0–44)
AST: 17 U/L (ref 15–41)
Albumin: 4.3 g/dL (ref 3.5–5.0)
Alkaline Phosphatase: 70 U/L (ref 38–126)
Anion gap: 6 (ref 5–15)
BUN: 15 mg/dL (ref 8–23)
CO2: 28 mmol/L (ref 22–32)
Calcium: 9.4 mg/dL (ref 8.9–10.3)
Chloride: 102 mmol/L (ref 98–111)
Creatinine, Ser: 1.18 mg/dL (ref 0.61–1.24)
GFR, Estimated: >60 mL/min (ref >60)
Glucose, Bld: 89 mg/dL (ref 70–99)
Potassium: 4.2 mmol/L (ref 3.5–5.1)
Sodium: 136 mmol/L (ref 135–145)
Total Bilirubin: 0.9 mg/dL (ref 0.3–1.2)
Total Protein: 7.8 g/dL (ref 6.5–8.1)

## 2021-12-17 LAB — LIPASE, BLOOD: Lipase: 34 U/L (ref 11–51)

## 2021-12-17 MED ORDER — POLYETHYLENE GLYCOL 3350 17 GM/SCOOP PO POWD: 0.5000 | Freq: Once | ORAL | 0 refills | Status: AC

## 2021-12-17 MED ORDER — SENNOSIDES-DOCUSATE SODIUM 8.6-50 MG PO TABS: 2.0000 | ORAL_TABLET | Freq: Every evening | ORAL | 2 refills | Status: AC | PRN

## 2021-12-17 NOTE — Discharge Instructions (Addendum)
Take 8 capfuls of Miralax in 32 oz of water (clear liquid) to help with constipation.   Stop by the pharmacy to pick up your senna.  Take 2 tablets at bedtime.

## 2021-12-17 NOTE — ED Triage Notes (Signed)
Pt c/o left sided abdominal pain. Started about 4 months ago. Last BM this morning. He states he also states he has vomited.

## 2021-12-17 NOTE — ED Provider Notes (Signed)
[facility]    CSN: [id number] Arrival date & time: 12/17/21  1755      History   Chief Complaint Chief Complaint  Patient presents with   Abdominal Pain    HPI Dezman Granda is a 61 y.o. male.   HPI  Pt reports left sided abdominal pain for the past 3 months.  Taking prune juice for constipation.  Last bowel movement this morning after drinking purne juice.  Yesterday, unable to go to the bathroom. No blood stool. Miralax and fiber used previously for constipation.  Decreased appetite.  Last ate around 11 AM. Sometimes feels nausea.  Denies vomiting. Has some left side back pain.  Has GERD and hx of constipation.  He is waking up at 3 AM at night.   Fever : no  Sore throat: no   Cough: no Nasal congestion : no  Appetite: normal  Hydration: normal  Abdominal pain: yes  Nausea: yes Vomiting: no Dysuria: no  Sleep disturbance: yes Back Pain: yes  Headache: no   History reviewed. No pertinent past medical history.  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 Authorizing Provider  senna-docusate (SENOKOT-S) 8.6-50 MG tablet Take 2 tablets by mouth at bedtime as needed for mild constipation. 12/17/21  Yes Damarys Speir, DO  ibuprofen (ADVIL) 600 MG tablet Take 1 tablet (600 mg total) by mouth every 6 (six) hours as needed. 06/24/21   Domenick Gong, MD  ondansetron (ZOFRAN-ODT) 8 MG disintegrating tablet Take 1 tablet (8 mg total) by mouth every 8 (eight) hours as needed for nausea or vomiting. 07/05/21   Rhys Martini, PA-C  pantoprazole (PROTONIX) 40 MG tablet Take 40 mg by mouth daily. 09/22/21   [provider]   polyethylene glycol powder (GLYCOLAX/MIRALAX) 17 GM/SCOOP powder Take 127.5 g by mouth once for 1 dose. 12/17/21 12/17/21  Katha Cabal, DO    Family History Family History  Problem Relation Age of Onset   Healthy Mother    Diabetes Father     Social History Social History   Tobacco Use   Smoking status: Former   Smokeless tobacco: Former  Building services engineer Use: Never used  Substance Use Topics   Alcohol use: No   Drug use: No     Allergies   Patient has no known allergies.   Review of Systems Review of Systems : :negative unless otherwise stated in HPI.      Physical Exam Triage Vital Signs ED Triage Vitals  Enc Vitals Group     BP 12/17/21 1810 127/78     Pulse Rate 12/17/21 1810 69     Resp 12/17/21 1810 16     Temp 12/17/21 1810 98.1 F (36.7 C)     Temp Source 12/17/21 1810 Oral     SpO2 12/17/21 1810 99 %     Weight 12/17/21 1808 174 lb 13.2 oz (79.3 kg)     Height 12/17/21 1808 5\' 11"  (1.803 m)     Head Circumference --      Peak Flow --      Pain Score 12/17/21 1807 7  Pain Loc --      Pain Edu? --      Excl. in GC? --    No data found.  Updated Vital Signs BP 127/78 (BP Location: Left Arm)   Pulse 69   Temp 98.1 F (36.7 C) (Oral)   Resp 16   Ht 5\' 11"  (1.803 m)   Wt 79.3 kg   SpO2 99%   BMI 24.38 kg/m   Visual Acuity Right Eye Distance:   Left Eye Distance:   Bilateral Distance:    Right Eye Near:   Left Eye Near:    Bilateral Near:     Physical Exam  GEN: pleasant well appearing male, in no acute distress  CV: regular rate  RESP: no increased work of breathing ABD: Bowel sounds present in all quadrants. Soft, non-tender in all quadrants,  mildly distended. No palpable masses  SKIN: warm, dry, no rash on visible skin  UC Treatments / Results  Labs (all labs ordered are listed, but only abnormal results are displayed) Labs Reviewed  CBC WITH DIFFERENTIAL/PLATELET  LIPASE, BLOOD  COMPREHENSIVE METABOLIC PANEL     EKG   Radiology DG Abdomen 1 View  Result Date: 12/17/2021 CLINICAL DATA:  Left abdominal pain. EXAM: ABDOMEN - 1 VIEW COMPARISON:  10/27/2021 FINDINGS: Normal bowel gas pattern. Prominent stool in the right colon and hepatic flexure. Unremarkable bones. IMPRESSION: No acute abnormality. Electronically Signed   By: 10/29/2021 M.D.   On: 12/17/2021 18:45    Procedures Procedures (including critical care time)  Medications Ordered in UC Medications - No data to display  Initial Impression / Assessment and Plan / UC Course  I have reviewed the triage vital signs and the nursing notes.  Pertinent labs & imaging results that were available during my care of the patient were reviewed by me and considered in my medical decision making (see chart for details).     Acute on Chronic Abdominal Pain  Patient is a 61 year old male with acute on chronic abdominal pain.  KUB with right-sided constipation.  Interestingly enough his pain is mostly on the left side.  Given his bowel habits I am concerned constipation may still be a factor in his pain.  Advised him to use high-dose MiraLAX.  Prescribed senna at bedtime.  Lipase unremarkable for pancreatitis.  CMP and CBC unremarkable.  Doubt acute abdomen.  Of note, he has an appointment with GI for next month.  Notes and prior imaging available from primary care whereas patient has similar abdominal pain.  He is scheduled for endoscopy for possible gastritis.  Etiology unclear at this time.  Advised to keep appointment with GI on 01/27/2022.   Discussed MDM, treatment plan and plan for follow-up with patient/parent who agrees with plan.     Final Clinical Impressions(s) / UC Diagnoses   Final diagnoses:  Abdominal pain, unspecified abdominal location     Discharge Instructions      Take 8 capfuls of Miralax in 32 oz of water (clear liquid) to help with constipation.   Stop by the pharmacy to pick up your senna.  Take 2 tablets at bedtime.         ED Prescriptions     Medication Sig Dispense Auth. Provider   senna-docusate (SENOKOT-S) 8.6-50 MG tablet Take 2 tablets by mouth at bedtime as needed for mild constipation. 60 tablet Payeton Germani, DO   polyethylene glycol powder (GLYCOLAX/MIRALAX) 17 GM/SCOOP powder Take 127.5 g by mouth once for 1 dose. 127.5 g  Katha Cabal, DO      PDMP not reviewed this encounter.   Katha Cabal, DO 12/17/21 2016

## 2022-01-27 ENCOUNTER — Ambulatory Visit: Payer: BC Managed Care – PPO | Admitting: Anesthesiology

## 2022-01-27 ENCOUNTER — Encounter
Admission: RE | Disposition: A | Discharge status: Home / Self Care | Payer: Self-pay | Source: Home / Self Care | Attending: Gastroenterology

## 2022-01-27 ENCOUNTER — Encounter: Payer: Self-pay | Admitting: *Deleted

## 2022-01-27 ENCOUNTER — Ambulatory Visit
Admission: RE | Admit: 2022-01-27 | Discharge: 2022-01-27 | Disposition: A | Discharge status: Home / Self Care | Payer: BC Managed Care – PPO | Attending: Gastroenterology | Admitting: Gastroenterology

## 2022-01-27 DIAGNOSIS — K2950 Unspecified chronic gastritis without bleeding: Secondary | ICD-10-CM | POA: Insufficient documentation

## 2022-01-27 DIAGNOSIS — R1013 Epigastric pain: Secondary | ICD-10-CM | POA: Diagnosis present

## 2022-01-27 HISTORY — PX: ESOPHAGOGASTRODUODENOSCOPY (EGD) WITH PROPOFOL: SHX5813

## 2022-01-27 SURGERY — ESOPHAGOGASTRODUODENOSCOPY (EGD) WITH PROPOFOL
Anesthesia: General

## 2022-01-27 MED ORDER — STERILE WATER FOR IRRIGATION IR SOLN
Status: DC | PRN
  Administered 2022-01-27: 60 mL

## 2022-01-27 MED ORDER — PROPOFOL 1000 MG/100ML IV EMUL
INTRAVENOUS | Status: AC
  Filled 2022-01-27: qty 100

## 2022-01-27 MED ORDER — LIDOCAINE HCL (CARDIAC) PF 100 MG/5ML IV SOSY
PREFILLED_SYRINGE | INTRAVENOUS | Status: DC | PRN
  Administered 2022-01-27: 70 mg via INTRAVENOUS

## 2022-01-27 MED ORDER — LIDOCAINE HCL (PF) 2 % IJ SOLN
INTRAMUSCULAR | Status: AC
  Filled 2022-01-27: qty 5

## 2022-01-27 MED ORDER — PROPOFOL 10 MG/ML IV BOLUS
INTRAVENOUS | Status: DC | PRN
  Administered 2022-01-27: 30 mg via INTRAVENOUS
  Administered 2022-01-27: 20 mg via INTRAVENOUS
  Administered 2022-01-27: 50 mg via INTRAVENOUS

## 2022-01-27 MED ORDER — SODIUM CHLORIDE 0.9 % IV SOLN: INTRAVENOUS | Status: DC

## 2022-01-27 MED ORDER — PROPOFOL 500 MG/50ML IV EMUL
INTRAVENOUS | Status: DC | PRN
  Administered 2022-01-27: 150 ug/kg/min via INTRAVENOUS

## 2022-01-27 NOTE — Anesthesia Postprocedure Evaluation (Signed)
Anesthesia Post Note  Patient: Daniel Coleman  Procedure(s) Performed: ESOPHAGOGASTRODUODENOSCOPY (EGD) WITH PROPOFOL  Patient location during evaluation: Endoscopy Anesthesia Type: General Level of consciousness: awake and alert Pain management: pain level controlled Vital Signs Assessment: post-procedure vital signs reviewed and stable Respiratory status: spontaneous breathing, nonlabored ventilation, respiratory function stable and patient connected to nasal cannula oxygen Cardiovascular status: blood pressure returned to baseline and stable Postop Assessment: no apparent nausea or vomiting Anesthetic complications: no   No notable events documented.   Last Vitals:  Vitals:   01/27/22 0946 01/27/22 0947  BP: 117/73   Pulse:    Resp:    Temp:  (!) 36 C  SpO2:      Last Pain:  Vitals:   01/27/22 1006  TempSrc:   PainSc: 0-No pain                 Ilene Qua

## 2022-01-27 NOTE — Op Note (Signed)
[facility] Patient Name: Daniel Coleman Procedure Date: 01/27/2022 9:10 AM MRN: [id number] Account #: [id number] Date of Birth: 02-20-1961 Admit Type: Outpatient Age: 61 Room: [facility] ENDO ROOM 1 Gender: Male Note Status: Finalized Instrument Name: Upper Endoscope [id number] Procedure:             Upper GI endoscopy Indications:           Dyspepsia Providers:             Andrey Farmer MD, MD Medicines:             Monitored Anesthesia Care Complications:         No immediate complications. Estimated blood loss:                         Minimal. Procedure:             Pre-Anesthesia Assessment:                        - Prior to the procedure, a History and Physical was                         performed, and patient medications and allergies were                         reviewed. The patient is competent. The risks and                         benefits of the procedure and the sedation options and                         risks were discussed with the patient. All questions                         were answered and informed consent was obtained.                         Patient identification and proposed procedure were                         verified by the physician, the nurse, the                         anesthesiologist, the anesthetist and the technician                         in the endoscopy suite. Mental Status Examination:                         alert and oriented. Airway Examination: normal                         oropharyngeal airway and neck mobility. Respiratory                         Examination: clear to auscultation. CV Examination:                         normal. Prophylactic Antibiotics: The patient does not  require prophylactic antibiotics. Prior                         Anticoagulants: The patient has taken no previous                         anticoagulant or antiplatelet agents. ASA Grade                          Assessment: II - A patient with mild systemic disease.                         After reviewing the risks and benefits, the patient                         was deemed in satisfactory condition to undergo the                         procedure. The anesthesia plan was to use monitored                         anesthesia care (MAC). Immediately prior to                         administration of medications, the patient was                         re-assessed for adequacy to receive sedatives. The                         heart rate, respiratory rate, oxygen saturations,                         blood pressure, adequacy of pulmonary ventilation, and                         response to care were monitored throughout the                         procedure. The physical status of the patient was                         re-assessed after the procedure.                        After obtaining informed consent, the endoscope was                         passed under direct vision. Throughout the procedure,                         the patient's blood pressure, pulse, and oxygen                         saturations were monitored continuously. The Endoscope                         was introduced through the mouth, and advanced to the  second part of duodenum. The upper GI endoscopy was                         accomplished without difficulty. The patient tolerated                         the procedure well. Findings:      The examined esophagus was normal.      The entire examined stomach was normal. Biopsies were taken with a cold       forceps for Helicobacter pylori testing. Estimated blood loss was       minimal.      The examined duodenum was normal. Impression:            - Normal esophagus.                        - Normal stomach. Biopsied.                        - Normal examined duodenum. Recommendation:        - Discharge patient to home.                        - Resume  previous diet.                        - Continue present medications.                        - Await pathology results.                        - Return to referring physician as previously                         scheduled. Procedure Code(s):     --- Professional ---                        [phone], Esophagogastroduodenoscopy, flexible,                         transoral; with biopsy, single or multiple Diagnosis Code(s):     --- Professional ---                        R10.13, Epigastric pain CPT copyright 2019 American Medical Association. All rights reserved. The codes documented in this report are preliminary and upon coder review may  be revised to meet current compliance requirements. Andrey Farmer MD, MD 01/27/2022 9:45:34 AM Number of Addenda: 0 Note Initiated On: 01/27/2022 9:10 AM Estimated Blood Loss:  Estimated blood loss was minimal.      [facility]

## 2022-01-27 NOTE — Transfer of Care (Signed)
Immediate Anesthesia Transfer of Care Note  Patient: Daniel Coleman  Procedure(s) Performed: ESOPHAGOGASTRODUODENOSCOPY (EGD) WITH PROPOFOL  Patient Location: PACU  Anesthesia Type:General  Level of Consciousness: sedated  Airway & Oxygen Therapy: Patient Spontanous Breathing  Post-op Assessment: Report given to RN and Post -op Vital signs reviewed and stable  Post vital signs: Reviewed and stable  Last Vitals:  Vitals Value Taken Time  BP 117/73 01/27/22 0946  Temp    Pulse 66 01/27/22 0947  Resp 14 01/27/22 0947  SpO2 100 % 01/27/22 0947  Vitals shown include unvalidated device data.  Last Pain:  Vitals:   01/27/22 0946  TempSrc:   PainSc: 0-No pain         Complications: No notable events documented.

## 2022-01-27 NOTE — H&P (Signed)
Outpatient short stay form Pre-procedure 01/27/2022  Lesly Rubenstein, MD  Primary Physician: Patient, No Pcp Per  Reason for visit:  Dyspepsia  History of present illness:    61 y/o gentleman with no significant PMH here for EGD for dyspepsia. No blood thinners. No family history of Gi malignancies. No significant abdominal surgeries.   No current facility-administered medications for this encounter.  Medications Prior to Admission  Medication Sig Dispense Refill Last Dose   pantoprazole (PROTONIX) 40 MG tablet Take 40 mg by mouth daily.   01/26/2022   ibuprofen (ADVIL) 600 MG tablet Take 1 tablet (600 mg total) by mouth every 6 (six) hours as needed. 30 tablet 0    ondansetron (ZOFRAN-ODT) 8 MG disintegrating tablet Take 1 tablet (8 mg total) by mouth every 8 (eight) hours as needed for nausea or vomiting. 20 tablet 0    senna-docusate (SENOKOT-S) 8.6-50 MG tablet Take 2 tablets by mouth at bedtime as needed for mild constipation. 60 tablet 2      No Known Allergies   History reviewed. No pertinent past medical history.  Review of systems:  Otherwise negative.    Physical Exam  Gen: Alert, oriented. Appears stated age.  HEENT: PERRLA. Lungs: No respiratory distress CV: RRR Abd: soft, benign, no masses. Ext: No edema    Planned procedures: Proceed with EGD. The patient understands the nature of the planned procedure, indications, risks, alternatives and potential complications including but not limited to bleeding, infection, perforation, damage to internal organs and possible oversedation/side effects from anesthesia. The patient agrees and gives consent to proceed.  Please refer to procedure notes for findings, recommendations and patient disposition/instructions.     Lesly Rubenstein, MD [facility]

## 2022-01-27 NOTE — Anesthesia Preprocedure Evaluation (Addendum)
Anesthesia Evaluation  Patient identified by MRN, date of birth, ID band Patient awake    Reviewed: Allergy & Precautions, NPO status , Patient's Chart, lab work & pertinent test results  History of Anesthesia Complications Negative for: history of anesthetic complications  Airway Mallampati: III  TM Distance: >3 FB Neck ROM: Full    Dental  (+) Poor Dentition, Dental Advisory Given   Pulmonary neg pulmonary ROS, former smoker,    Pulmonary exam normal breath sounds clear to auscultation       Cardiovascular Exercise Tolerance: Good negative cardio ROS Normal cardiovascular exam Rhythm:Regular Rate:Normal     Neuro/Psych negative neurological ROS  negative psych ROS   GI/Hepatic Neg liver ROS, GERD  Medicated,  Endo/Other  negative endocrine ROS  Renal/GU Renal diseaseStones   negative genitourinary   Musculoskeletal negative musculoskeletal ROS (+)   Abdominal   Peds negative pediatric ROS (+)  Hematology negative hematology ROS (+)   Anesthesia Other Findings   Reproductive/Obstetrics negative OB ROS                            Anesthesia Physical Anesthesia Plan  ASA: 2  Anesthesia Plan: General   Post-op Pain Management: Minimal or no pain anticipated   Induction: Intravenous  PONV Risk Score and Plan: 1 and Propofol infusion and TIVA  Airway Management Planned: Natural Airway and Nasal Cannula  Additional Equipment:   Intra-op Plan:   Post-operative Plan:   Informed Consent: I have reviewed the patients History and Physical, chart, labs and discussed the procedure including the risks, benefits and alternatives for the proposed anesthesia with the patient or authorized representative who has indicated his/her understanding and acceptance.     Dental Advisory Given  Plan Discussed with: Anesthesiologist, CRNA and Surgeon  Anesthesia Plan Comments: (Patient  consented for risks of anesthesia including but not limited to:  - adverse reactions to medications - risk of airway placement if required - damage to eyes, teeth, lips or other oral mucosa - nerve damage due to positioning  - sore throat or hoarseness - Damage to heart, brain, nerves, lungs, other parts of body or loss of life  Patient voiced understanding.)        Anesthesia Quick Evaluation

## 2022-01-27 NOTE — Interval H&P Note (Signed)
History and Physical Interval Note:  01/27/2022 9:28 AM  Daniel Coleman  has presented today for surgery, with the diagnosis of DYSPEPSIA.  The various methods of treatment have been discussed with the patient and family. After consideration of risks, benefits and other options for treatment, the patient has consented to  Procedure(s) with comments: ESOPHAGOGASTRODUODENOSCOPY (EGD) WITH PROPOFOL (N/A) - NEED INTERPRETER WOLOF as a surgical intervention.  The patient's history has been reviewed, patient examined, no change in status, stable for surgery.  I have reviewed the patient's chart and labs.  Questions were answered to the patient's satisfaction.     Lesly Rubenstein  Ok to proceed with EGD

## 2022-01-28 ENCOUNTER — Encounter: Payer: Self-pay | Admitting: Gastroenterology

## 2022-01-28 LAB — SURGICAL PATHOLOGY

## 2022-04-10 ENCOUNTER — Ambulatory Visit
Admission: EM | Admit: 2022-04-10 | Discharge: 2022-04-10 | Disposition: A | Discharge status: Home / Self Care | Payer: BC Managed Care – PPO | Attending: Urgent Care | Admitting: Urgent Care

## 2022-04-10 ENCOUNTER — Encounter: Payer: Self-pay | Admitting: Emergency Medicine

## 2022-04-10 DIAGNOSIS — Z1152 Encounter for screening for COVID-19: Secondary | ICD-10-CM | POA: Diagnosis not present

## 2022-04-10 DIAGNOSIS — J101 Influenza due to other identified influenza virus with other respiratory manifestations: Secondary | ICD-10-CM | POA: Diagnosis not present

## 2022-04-10 LAB — RESP PANEL BY RT-PCR (FLU A&B, COVID) ARPGX2
Influenza A by PCR: POSITIVE — AB
Influenza B by PCR: NEGATIVE
SARS Coronavirus 2 by RT PCR: NEGATIVE

## 2022-04-10 MED ORDER — OSELTAMIVIR PHOSPHATE 75 MG PO CAPS: 75.0000 mg | ORAL_CAPSULE | Freq: Two times a day (BID) | ORAL | 0 refills | Status: DC

## 2022-04-10 NOTE — Discharge Instructions (Addendum)
Scribed Tamiflu, an antiviral medication, for your diagnosis of influenza A.  Please continue to use over-the-counter medication to treat your symptoms including Tylenol for fever.  Follow up here or with your primary care provider if your symptoms are worsening or not improving with treatment.

## 2022-04-10 NOTE — ED Triage Notes (Signed)
Patient c/o bodyaches, cough and chest congestion for 2 days.

## 2022-04-10 NOTE — ED Provider Notes (Signed)
[facility]    CSN: [id number] Arrival date & time: 04/10/22  1420      History   Chief Complaint Chief Complaint  Patient presents with   Cough    HPI Daniel Coleman is a 61 y.o. male.    Cough   Presents to urgent care with complaint of myalgias, cough, chest congestion x 2 days. Denies sore throat.  No documented fever.  Body aches are "everywhere". Endorses pain when he touches his skin.  History reviewed. No pertinent past medical history.  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 Authorizing Provider  pantoprazole (PROTONIX) 40 MG tablet Take 40 mg by mouth daily. 09/22/21  Yes [provider]  ibuprofen (ADVIL) 600 MG tablet Take 1 tablet (600 mg total) by mouth every 6 (six) hours as needed. 06/24/21   Domenick Gong, MD  ondansetron (ZOFRAN-ODT) 8 MG disintegrating tablet Take 1 tablet (8 mg total) by mouth every 8 (eight) hours as needed for nausea or vomiting. 07/05/21   Rhys Martini, PA-C  senna-docusate (SENOKOT-S) 8.6-50 MG tablet Take 2 tablets by mouth at bedtime as needed for mild constipation. 12/17/21   Katha Cabal, DO    Family History Family History  Problem Relation Age of Onset   Healthy Mother    Diabetes Father     Social History Social History   Tobacco Use   Smoking status: Former   Smokeless tobacco: Former  Building services engineer Use:  Never used  Substance Use Topics   Alcohol use: No   Drug use: No     Allergies   Patient has no known allergies.   Review of Systems Review of Systems  Respiratory:  Positive for cough.      Physical Exam Triage Vital Signs ED Triage Vitals  Enc Vitals Group     BP 04/10/22 1546 117/76     Pulse Rate 04/10/22 1546 65     Resp 04/10/22 1546 15     Temp 04/10/22 1546 98.9 F (37.2 C)     Temp Source 04/10/22 1546 Oral     SpO2 04/10/22 1546 99 %     Weight 04/10/22 1543 166 lb 0.1 oz (75.3 kg)     Height 04/10/22 1543 5\' 11"  (1.803 m)     Head Circumference --      Peak Flow --      Pain Score 04/10/22 1543 8     Pain Loc --      Pain Edu? --      Excl. in [place]? --    No data found.  Updated Vital Signs BP 117/76 (BP Location: Right Arm)   Pulse 65   Temp 98.9 F (37.2 C) (Oral)   Resp 15   Ht 5\' 11"  (1.803 m)   Wt 166 lb 0.1 oz (75.3 kg)   SpO2 99%   BMI 23.15 kg/m  Visual Acuity Right Eye Distance:   Left Eye Distance:   Bilateral Distance:    Right Eye Near:   Left Eye Near:    Bilateral Near:     Physical Exam Vitals reviewed.  Constitutional:      Appearance: Normal appearance. He is ill-appearing.  HENT:     Right Ear: Tympanic membrane normal.     Left Ear: Tympanic membrane normal.     Mouth/Throat:     Mouth: Mucous membranes are moist.     Pharynx: No oropharyngeal exudate or posterior oropharyngeal erythema.  Eyes:     Conjunctiva/sclera: Conjunctivae normal.     Pupils: Pupils are equal, round, and reactive to light.  Cardiovascular:     Rate and Rhythm: Normal rate and regular rhythm.     Pulses: Normal pulses.     Heart sounds: Normal heart sounds.  Pulmonary:     Effort: Pulmonary effort is normal.     Breath sounds: Normal breath sounds.  Skin:    General: Skin is warm and dry.  Neurological:     General: No focal deficit present.     Mental Status: He is alert and oriented to person, place, and time.  Psychiatric:         Mood and Affect: Mood normal.        Behavior: Behavior normal.      UC Treatments / Results  Labs (all labs ordered are listed, but only abnormal results are displayed) Labs Reviewed  RESP PANEL BY RT-PCR (FLU A&B, COVID) [id number]    EKG   Radiology No results found.  Procedures Procedures (including critical care time)  Medications Ordered in UC Medications - No data to display  Initial Impression / Assessment and Plan / UC Course  I have reviewed the triage vital signs and the nursing notes.  Pertinent labs & imaging results that were available during my care of the patient were reviewed by me and considered in my medical decision making (see chart for details).   Patient is afebrile here with tylenol. Satting well on room air. Overall is ill appearing, though well hydrated and without respiratory distress. Pulmonary exam is unremarkable.  Lungs CTAB.  No pharyngeal erythema.  No peritonsillar exudates.  Suspect viral pathology including influenza.  Results for respiratory swab are pending and patient has elected to wait for results.  Positive for influenza A and prescribing Tamiflu.  Final Clinical Impressions(s) / UC Diagnoses   Final diagnoses:  None   Discharge Instructions   None    ED Prescriptions   None    PDMP not reviewed this encounter.   Charma Igo, [place] 04/10/22 [phone]

## 2022-05-25 ENCOUNTER — Ambulatory Visit
Admission: EM | Admit: 2022-05-25 | Discharge: 2022-05-25 | Disposition: A | Discharge status: Home / Self Care | Payer: BC Managed Care – PPO | Attending: Physician Assistant | Admitting: Physician Assistant

## 2022-05-25 ENCOUNTER — Other Ambulatory Visit: Payer: Self-pay

## 2022-05-25 DIAGNOSIS — L02811 Cutaneous abscess of head [any part, except face]: Secondary | ICD-10-CM

## 2022-05-25 MED ORDER — DOXYCYCLINE HYCLATE 100 MG PO CAPS: 100.0000 mg | ORAL_CAPSULE | Freq: Two times a day (BID) | ORAL | 0 refills | Status: AC

## 2022-05-25 NOTE — ED Provider Notes (Signed)
[facility]    CSN: [id number] Arrival date & time: 05/25/22  1748      History   Chief Complaint Chief Complaint  Patient presents with   Abscess    HPI Daniel Coleman is a 62 y.o. male presenting for 4 to 5-day history of an area of swelling, redness and pustular drainage behind the left ear.  He has never had anything this before.  He has been trying to clean it in the shower.  He has not applied anything to it.  He has not had any fevers and no history of MRSA or recurrent skin infections.  No other complaints.  HPI  History reviewed. No pertinent past medical history.  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 Authorizing Provider  doxycycline (VIBRAMYCIN) 100 MG capsule Take 1 capsule (100 mg total) by mouth 2 (two) times daily for 7 days. 05/25/22 06/01/22 Yes Laurene Footman B, PA-C  ibuprofen (ADVIL) 600 MG tablet Take 1 tablet (600 mg total) by mouth every 6 (six) hours as needed. 06/24/21   Melynda Ripple, MD  ondansetron (ZOFRAN-ODT) 8 MG disintegrating tablet Take 1 tablet (8 mg total) by mouth every 8 (eight) hours as needed for nausea or vomiting. 07/05/21   Hazel Sams, PA-C  oseltamivir (TAMIFLU) 75 MG capsule Take 1 capsule (75 mg total) by mouth every 12 (twelve) hours. 04/10/22   Immordino, Annie Main, FNP  pantoprazole (PROTONIX) 40 MG tablet Take 40 mg by mouth daily. 09/22/21    [provider]  senna-docusate (SENOKOT-S) 8.6-50 MG tablet Take 2 tablets by mouth at bedtime as needed for mild constipation. 12/17/21   Lyndee Hensen, DO    Family History Family History  Problem Relation Age of Onset   Healthy Mother    Diabetes Father     Social History Social History   Tobacco Use   Smoking status: Former   Smokeless tobacco: Former  Scientific laboratory technician Use: Never used  Substance Use Topics   Alcohol use: No   Drug use: No     Allergies   Patient has no known allergies.   Review of Systems Review of Systems  Constitutional:  Negative for fatigue and fever.  Musculoskeletal:  Negative for joint swelling and neck pain.  Skin:  Positive for color change and wound.  Neurological:  Negative for weakness and headaches.  Hematological:  Negative for adenopathy.     Physical Exam Triage Vital Signs ED Triage Vitals [05/25/22 1808]  Enc Vitals Group     BP      Pulse      Resp      Temp      Temp src      SpO2      Weight 166 lb 0.1 oz (75.3 kg)     Height 5\' 11"  (1.803 m)     Head Circumference      Peak  Flow      Pain Score 6     Pain Loc      Pain Edu?      Excl. in [place]?    No data found.  Updated Vital Signs BP 115/84 (BP Location: Left Arm)   Pulse 64   Temp 98.3 F (36.8 C) (Oral)   Resp 16   Ht 5\' 11"  (1.803 m)   Wt 166 lb 0.1 oz (75.3 kg)   SpO2 99%   BMI 23.15 kg/m      Physical Exam Vitals and nursing note reviewed.  Constitutional:      General: He is not in acute distress.    Appearance: Normal appearance. He is well-developed. He is not ill-appearing.  HENT:     Head: Normocephalic and atraumatic.  Eyes:     General: No scleral icterus.    Conjunctiva/sclera: Conjunctivae normal.  Cardiovascular:     Rate and Rhythm: Normal rate and regular rhythm.  Pulmonary:     Effort: Pulmonary effort is normal. No respiratory distress.  Musculoskeletal:     Cervical back: Neck supple.  Skin:    General:  Skin is warm and dry.     Capillary Refill: Capillary refill takes less than 2 seconds.     Comments: 2 cm x 2 cm area of induration and fluctuance with pustular drainage --posterior to left ear  Neurological:     General: No focal deficit present.     Mental Status: He is alert. Mental status is at baseline.     Motor: No weakness.     Gait: Gait normal.  Psychiatric:        Mood and Affect: Mood normal.        Behavior: Behavior normal.      UC Treatments / Results  Labs (all labs ordered are listed, but only abnormal results are displayed) Labs Reviewed - No data to display  EKG   Radiology No results found.  Procedures Procedures (including critical care time)  Medications Ordered in UC Medications - No data to display  Initial Impression / Assessment and Plan / UC Course  I have reviewed the triage vital signs and the nursing notes.  Pertinent labs & imaging results that were available during my care of the patient were reviewed by me and considered in my medical decision making (see chart for details).   62 year old male presents for abscess of the left posterior ear for the past few days.  It is draining pustular material.  No fever.  He agrees to allow me to drain the abscess.  I initially tried to press on the area since it was already draining but he could not handle that because it was too painful.  Instilled 3 mL lidocaine around and into the abscess.  Then I used a 11 blade scalpel and was able to express moderate amount of pustular and bloody material.  Cleaned with saline.  Covered with Band-Aid.  Prescribed doxycycline.  Advised to continue cleaning the area with soap and water and change the bandage regularly.  Reviewed wound care.  Reviewed following up with PCP.  Reviewed ED precautions.  Final Clinical Impressions(s) / UC Diagnoses   Final diagnoses:  Abscess of head     Discharge Instructions      -Clean area with soap and water.  You can do this  in the shower.  Do this every day.  It is going to continue to drain blood and pus.  You may need  to keep it bandaged and change the bandage regularly.  It should get better over the next couple of days.  Start antibiotics tonight. - Follow-up with PCP as scheduled. - Return if fever or abscess is getting bigger or you have increased pain. - You can take Tylenol Motrin as needed for pain relief.     ED Prescriptions     Medication Sig Dispense Auth. Provider   doxycycline (VIBRAMYCIN) 100 MG capsule Take 1 capsule (100 mg total) by mouth 2 (two) times daily for 7 days. 14 capsule Shirlee Latch, PA-C      PDMP not reviewed this encounter.   Shirlee Latch, PA-C 05/25/22 [id number]

## 2022-05-25 NOTE — Discharge Instructions (Signed)
-  Clean area with soap and water.  You can do this in the shower.  Do this every day.  It is going to continue to drain blood and pus.  You may need to keep it bandaged and change the bandage regularly.  It should get better over the next couple of days.  Start antibiotics tonight. - Follow-up with PCP as scheduled. - Return if fever or abscess is getting bigger or you have increased pain. - You can take Tylenol Motrin as needed for pain relief.

## 2022-05-25 NOTE — ED Triage Notes (Signed)
Abscess to left neck behind left ear x 4 days.  Draining pus.

## 2022-12-05 ENCOUNTER — Ambulatory Visit
Admission: EM | Admit: 2022-12-05 | Discharge: 2022-12-05 | Disposition: A | Discharge status: Home / Self Care | Payer: 59 | Attending: Family Medicine | Admitting: Family Medicine

## 2022-12-05 DIAGNOSIS — S161XXA Strain of muscle, fascia and tendon at neck level, initial encounter: Secondary | ICD-10-CM | POA: Diagnosis not present

## 2022-12-05 MED ORDER — NAPROXEN 500 MG PO TABS: 500.0000 mg | ORAL_TABLET | Freq: Two times a day (BID) | ORAL | 0 refills | Status: DC

## 2022-12-05 MED ORDER — METHOCARBAMOL 500 MG PO TABS: 500.0000 mg | ORAL_TABLET | Freq: Two times a day (BID) | ORAL | 0 refills | Status: DC

## 2022-12-05 NOTE — Discharge Instructions (Signed)
If medication was prescribed, stop by the pharmacy to pick up your prescriptions.  For your  pain, Take  1500 mg Tylenol twice a day, take muscle relaxer (Robaxin) twice a day, take  Naprosyn twice a day,  as needed for pain.  Apply warm compresses intermittently, as needed.  As pain recedes, begin normal activities slowly as tolerated.  Follow up with primary care provider or an orthopedic provider, if symptoms persist.  Watch for worsening symptoms such as an increasing weakness or loss of sensation, increasing pain and/or the loss of bladder or bowel function. Should any of these occur, go to the emergency department immediately.

## 2022-12-05 NOTE — ED Provider Notes (Signed)
[facility]    CSN: [id number] Arrival date & time: 12/05/22  1521      History   Chief Complaint Chief Complaint  Patient presents with   Neck Pain    HPI  HPI Daniel Coleman is a 62 y.o. male.   Daniel Coleman presents for neck pain. 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 Pain started 2 weeks ago but gets worse at night. Turning in bed he feels pain in the neck. Yesterday, had soreness in the right side of neck.  He hasn't down "nothing" the last couple of days.  Rated 7/10 and described as pulling and tightness.  Does not hear any abnormal sounds. Daniel Coleman preveious neck injury.  Tried some tiger palm.      History reviewed. No pertinent past medical history.  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 Authorizing Provider  methocarbamol (ROBAXIN) 500 MG tablet Take 1 tablet (500 mg total) by mouth 2 (two) times daily. 12/05/22  Yes Savina Olshefski, DO  naproxen (NAPROSYN) 500 MG tablet Take 1 tablet (500 mg total) by mouth 2 (two) times daily with a meal. 12/05/22  Yes Kaydin Labo, DO  pantoprazole (PROTONIX) 40 MG tablet Take 40 mg by mouth daily. 09/22/21  Yes [provider]  senna-docusate (SENOKOT-S) 8.6-50 MG tablet Take 2 tablets by mouth at bedtime as needed for mild constipation. 12/17/21   Katha Cabal, DO    Family History Family History  Problem Relation Age of Onset    Healthy Mother    Diabetes Father     Social History Social History   Tobacco Use   Smoking status: Former   Smokeless tobacco: Former  Building services engineer status: Never Used  Substance Use Topics   Alcohol use: No   Drug use: No     Allergies   Patient has no known allergies.   Review of Systems Review of Systems: egative unless otherwise stated in HPI.      Physical Exam Triage Vital Signs ED Triage Vitals [12/05/22 1529]  Encounter Vitals Group     BP 125/84     Systolic BP Percentile      Diastolic BP Percentile      Pulse Rate 65     Resp 16     Temp 98.2 F (36.8 C)     Temp Source Oral     SpO2 95 %     Weight 176 lb (79.8 kg)     Height      Head Circumference      Peak Flow      Pain Score 8     Pain Loc      Pain Education      Exclude from Growth Chart    No data found.  Updated Vital Signs BP 125/84 (BP Location: Right Arm)   Pulse 65   Temp 98.2 F (36.8 C) (  Oral)   Resp 16   Wt 79.8 kg   SpO2 95%   BMI 24.55 kg/m   Visual Acuity Right Eye Distance:   Left Eye Distance:   Bilateral Distance:    Right Eye Near:   Left Eye Near:    Bilateral Near:     Physical Exam GEN: well appearing male in no acute distress  CVS: well perfused  RESP: speaking in full sentences without pause, no respiratory distress  MSK:  Cervical spine: - Inspection: no gross deformity or asymmetry, swelling or ecchymosis. No skin changes  - Palpation: No TTP over the spinous processes, right trapezium and paraspinal muscles,  - ROM: full active ROM of the lumbar spine in flexion and extension   - Strength: 5/5 strength of upper and lower extremities  - Neuro: sensation intact  - Special testing: Spurling: negative  SKIN: warm, dry, no overly skin rash or erythema    UC Treatments / Results  Labs (all labs ordered are listed, but only abnormal results are displayed) Labs Reviewed - No data to display  EKG   Radiology No results  found.   Procedures Procedures (including critical care time)  Medications Ordered in UC Medications - No data to display  Initial Impression / Assessment and Plan / UC Course  I have reviewed the triage vital signs and the nursing notes.  Pertinent labs & imaging results that were available during my care of the patient were reviewed by me and considered in my medical decision making (see chart for details).      Pt is a 62 y.o.  male with acute neck pain. He pushes a lot at work and may have slept on it wrong. On exam has muscular related pain on the right. No no spinous process pain to suggest fracture therefore imaging deferred. Pt agrees with this plan. Highly suspect muscular strain.    Patient to gradually return to normal activities, as tolerated and continue ordinary activities within the limits permitted by pain. Prescribed Naproxen sodium  and muscle relaxer  for pain relief.  Advised patient to avoid other NSAIDs while taking Naprosyn. Tylenol and Lidocaine patches PRN for multimodal pain relief. Counseled patient on red flag symptoms and when to seek immediate care.  No red flags suggesting cauda equina syndrome or progressive major motor weakness. Patient to follow up with orthopedic provider if symptoms do not improve with conservative treatment.  Return and ED precautions given.    Discussed MDM, treatment plan and plan for follow-up with patient who agrees with plan.   Final Clinical Impressions(s) / UC Diagnoses   Final diagnoses:  Acute strain of neck muscle, initial encounter     Discharge Instructions      If medication was prescribed, stop by the pharmacy to pick up your prescriptions.  For your  pain, Take 1500 mg Tylenol twice a day, take muscle relaxer (Robaxin) twice a day, take Naprosyn twice a day,  as needed for pain.  Apply warm compresses intermittently, as needed.  As pain recedes, begin normal activities slowly as tolerated.  Follow up with primary  care provider or an orthopedic provider, if symptoms persist.  Watch for worsening symptoms such as an increasing weakness or loss of sensation, increasing pain and/or the loss of bladder or bowel function. Should any of these occur, go to the emergency department immediately.        ED Prescriptions     Medication Sig Dispense Auth. Provider   methocarbamol (ROBAXIN)  500 MG tablet Take 1 tablet (500 mg total) by mouth 2 (two) times daily. 20 tablet Angelique Chevalier, DO   naproxen (NAPROSYN) 500 MG tablet Take 1 tablet (500 mg total) by mouth 2 (two) times daily with a meal. 30 tablet Laquanna Veazey, DO      PDMP not reviewed this encounter.   Katha Cabal, DO 12/13/22 0154

## 2022-12-05 NOTE — ED Triage Notes (Signed)
Neck pain for 2 weeks. Right side. Hasn't taken any meds.

## 2022-12-30 ENCOUNTER — Ambulatory Visit
Admission: RE | Admit: 2022-12-30 | Discharge: 2022-12-30 | Disposition: A | Discharge status: Home / Self Care | Payer: 59 | Source: Ambulatory Visit | Attending: Family Medicine | Admitting: Family Medicine

## 2022-12-30 ENCOUNTER — Ambulatory Visit
Admission: RE | Admit: 2022-12-30 | Discharge: 2022-12-30 | Disposition: A | Discharge status: Home / Self Care | Payer: 59 | Attending: Family Medicine | Admitting: Family Medicine

## 2022-12-30 ENCOUNTER — Other Ambulatory Visit: Payer: Self-pay | Admitting: Family Medicine

## 2022-12-30 DIAGNOSIS — M542 Cervicalgia: Secondary | ICD-10-CM | POA: Diagnosis not present

## 2023-03-29 ENCOUNTER — Ambulatory Visit: Payer: 59

## 2023-03-29 ENCOUNTER — Ambulatory Visit
Admission: EM | Admit: 2023-03-29 | Discharge: 2023-03-29 | Disposition: A | Discharge status: Home / Self Care | Payer: 59 | Attending: Physician Assistant | Admitting: Physician Assistant

## 2023-03-29 DIAGNOSIS — S62524A Nondisplaced fracture of distal phalanx of right thumb, initial encounter for closed fracture: Secondary | ICD-10-CM

## 2023-03-29 DIAGNOSIS — M79641 Pain in right hand: Secondary | ICD-10-CM | POA: Diagnosis not present

## 2023-03-29 MED ORDER — TRAMADOL HCL 50 MG PO TABS: 50.0000 mg | ORAL_TABLET | Freq: Three times a day (TID) | ORAL | 0 refills | Status: AC | PRN

## 2023-03-29 NOTE — Discharge Instructions (Signed)
-  You broke the tip of your thumb -Wear the brace at all times. May take off to shower but try to wear at all other times -Ice and elevate your thumb -May take ibuprofen as needed for pain. I sent other pain medicine for severe pain if needed. -No use of thumb until cleared by orthopedics -Follow up with ortho. Call this week for appointment.   You have a condition requiring you to follow up with Orthopedics so please call one of the following office for appointment:   [facility] Address: [address] [place], [place] [address] Phone: [phone]  [facility] [address], [place], [place] [address] Phone: [phone]  [facility] [address], [place], [place] [address] Phone: [phone]

## 2023-03-29 NOTE — ED Provider Notes (Signed)
[facility]    CSN: [id number] Arrival date & time: 03/29/23  7829      History   Chief Complaint Chief Complaint  Patient presents with   Hand Pain    RT thumb    HPI Daniel Coleman is a 62 y.o. male presenting for right distal thumb pain since yesterday.  He reports hurting it on a conveyor belt that was running.  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  He reports pain of the right distal thumb and swelling.  No wounds or contusions.  No history of fracture.  No numbness, weakness or tingling.  He is right-handed.  No other injuries reported.  Has not taken any meds over-the-counter.  HPI  History reviewed. No pertinent past medical history.  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 Authorizing Provider  traMADol (ULTRAM) 50 MG tablet Take 1 tablet (50 mg total) by mouth every 8 (eight) hours as needed for up to 5 days for moderate pain (pain score 4-6). 03/29/23 04/03/23 Yes Eusebio Friendly B, PA-C  pantoprazole (PROTONIX) 40 MG tablet Take 40 mg by mouth daily. 09/22/21   [provider]  senna-docusate (SENOKOT-S) 8.6-50 MG tablet Take 2 tablets by mouth at bedtime as needed for mild constipation. 12/17/21   Katha Cabal, DO    Family History Family History  Problem Relation Age of Onset   Healthy Mother    Diabetes Father     Social History Social History    Tobacco Use   Smoking status: Former   Smokeless tobacco: Former  Building services engineer status: Never Used  Substance Use Topics   Alcohol use: No   Drug use: No     Allergies   Patient has no known allergies.   Review of Systems Review of Systems  Musculoskeletal:  Positive for arthralgias and joint swelling.  Skin:  Positive for color change. Negative for wound.  Neurological:  Negative for weakness and numbness.     Physical Exam Triage Vital Signs ED Triage Vitals  Encounter Vitals Group     BP 03/29/23 0901 (!) 141/77     Systolic BP Percentile --      Diastolic BP Percentile --      Pulse Rate 03/29/23 0901 64     Resp --      Temp 03/29/23 0901 98.2 F (36.8 C)     Temp Source 03/29/23 0901 Oral     SpO2 03/29/23 0901 100 %     Weight --      Height --      Head Circumference --      Peak Flow --      Pain Score 03/29/23 0900 10     Pain Loc --      Pain Education --      Exclude from Growth Chart --    No data found.  Updated Vital Signs  BP (!) 141/77 (BP Location: Left Arm)   Pulse 64   Temp 98.2 F (36.8 C) (Oral)   SpO2 100%    Physical Exam Vitals and nursing note reviewed.  Constitutional:      General: He is not in acute distress.    Appearance: Normal appearance. He is well-developed. He is not ill-appearing.  HENT:     Head: Normocephalic and atraumatic.  Eyes:     General: No scleral icterus.    Conjunctiva/sclera: Conjunctivae normal.  Cardiovascular:     Rate and Rhythm: Normal rate.     Pulses: Normal pulses.  Pulmonary:     Effort: Pulmonary effort is normal. No respiratory distress.  Musculoskeletal:     Cervical back: Neck supple.     Comments: RIGHT THUMB: There is swelling of the distal thumb without wounds. Decreased ROM of distal thumb. TTP distal phalanx. No stuff box tenderness. Good pulses and strength.  Skin:    General: Skin is warm and dry.     Capillary Refill: Capillary refill takes less than 2 seconds.   Neurological:     General: No focal deficit present.     Mental Status: He is alert. Mental status is at baseline.     Motor: No weakness.     Gait: Gait normal.  Psychiatric:        Mood and Affect: Mood normal.        Behavior: Behavior normal.      UC Treatments / Results  Labs (all labs ordered are listed, but only abnormal results are displayed) Labs Reviewed - No data to display  EKG   Radiology DG Finger Thumb Right  Result Date: 03/29/2023 CLINICAL DATA:  Indirect infection about last night. Right thumb pain. EXAM: RIGHT THUMB 2+V COMPARISON:  None Available. FINDINGS: Normal bone mineralization. There is a mildly obliqued linear lucency within the distal third of the distal phalanx of the thumb extending to the medial, lateral, and volar cortices, an acute nondisplaced fracture. Minimal peripheral thumb interphalangeal and thumb metacarpophalangeal degenerative spurring without significant joint space narrowing. Mild radiocarpal joint space narrowing and peripheral osteophytosis. IMPRESSION: Acute nondisplaced fracture of the distal third of the distal phalanx of the thumb. Electronically Signed   By: Neita Garnet M.D.   On: 03/29/2023 11:07    Procedures Procedures (including critical care time)  Medications Ordered in UC Medications - No data to display  Initial Impression / Assessment and Plan / UC Course  I have reviewed the triage vital signs and the nursing notes.  Pertinent labs & imaging results that were available during my care of the patient were reviewed by me and considered in my medical decision making (see chart for details).   62 year old male presents for right distal thumb pain after injuring it on a conveyor belt yesterday.  On exam he has swelling and tenderness of the distal thumb and reduced range of motion at the distal joint.  X-ray of thumb today shows fracture of distal phalanx which is nondisplaced.  Reviewed x-ray results patient.   Discussed fracture care guidelines and RICE guidelines.  He was placed in a thumb spica splint.  Advised ibuprofen for discomfort and Tylenol.  Sent Ultram as needed for severe pain.  Reviewed the importance of following up with orthopedics.  Information given to patient.  Advised him not to use affected thumb until cleared by orthopedics.  Work note given.   Final Clinical Impressions(s) / UC Diagnoses   Final diagnoses:  Nondisplaced  fracture of distal phalanx of right thumb, initial encounter for closed fracture  Right hand pain     Discharge Instructions      -You broke the tip of your thumb -Wear the brace at all times. May take off to shower but try to wear at all other times -Ice and elevate your thumb -May take ibuprofen as needed for pain. I sent other pain medicine for severe pain if needed. -No use of thumb until cleared by orthopedics -Follow up with ortho. Call this week for appointment.   You have a condition requiring you to follow up with Orthopedics so please call one of the following office for appointment:   [facility] Address: [address], [place], [place] [address] Phone: [phone]  [facility] [address] [place], [place] [address] Phone: [phone]  [facility] [address] [place], [place] [address] Phone: [phone]      ED Prescriptions     Medication Sig Dispense Auth. Provider   traMADol (ULTRAM) 50 MG tablet Take 1 tablet (50 mg total) by mouth every 8 (eight) hours as needed for up to 5 days for moderate pain (pain score 4-6). 15 tablet Shirlee Latch, PA-C      I have reviewed the PDMP during this encounter.   Shirlee Latch, PA-C 03/29/23 1122

## 2023-03-29 NOTE — ED Triage Notes (Signed)
Pt presents to UC c/o RT thumb pain since last night, pt states he stuck his hand inside convection belt that was running and injured RT thumb.

## 2023-07-29 ENCOUNTER — Ambulatory Visit
Admission: EM | Admit: 2023-07-29 | Discharge: 2023-07-29 | Disposition: A | Discharge status: Home / Self Care | Attending: Physician Assistant | Admitting: Physician Assistant

## 2023-07-29 ENCOUNTER — Encounter: Payer: Self-pay | Admitting: Emergency Medicine

## 2023-07-29 DIAGNOSIS — K148 Other diseases of tongue: Secondary | ICD-10-CM

## 2023-07-29 DIAGNOSIS — K140 Glossitis: Secondary | ICD-10-CM | POA: Diagnosis not present

## 2023-07-29 DIAGNOSIS — R051 Acute cough: Secondary | ICD-10-CM | POA: Diagnosis not present

## 2023-07-29 DIAGNOSIS — Z87891 Personal history of nicotine dependence: Secondary | ICD-10-CM

## 2023-07-29 MED ORDER — AMOXICILLIN-POT CLAVULANATE 875-125 MG PO TABS: 1.0000 | ORAL_TABLET | Freq: Two times a day (BID) | ORAL | 0 refills | Status: AC

## 2023-07-29 MED ORDER — PROMETHAZINE-DM 6.25-15 MG/5ML PO SYRP: 5.0000 mL | ORAL_SOLUTION | Freq: Four times a day (QID) | ORAL | 0 refills | Status: DC | PRN

## 2023-07-29 MED ORDER — LIDOCAINE VISCOUS HCL 2 % MT SOLN: 15.0000 mL | OROMUCOSAL | 0 refills | Status: DC | PRN

## 2023-07-29 NOTE — ED Triage Notes (Signed)
 Pt presents with pain underneath his tongue for the past 2 days. Pt states it hurts to eat and he can see something white under his tongue.

## 2023-07-29 NOTE — Discharge Instructions (Signed)
-  You have an infection in your tongue.  I sent antibiotics to pharmacy as well as lidocaine solution when she can put on the area to help numb it.  You can also use Orajel.  Tylenol and ibuprofen for pain, plenty of rest and fluids.  I sent cough medicine 2.  Consider getting on an antihistamine since it is allergy season. - If tongue lesion is not going away over the next 7 to 10 days or gets worse seek reevaluation. -If fever, increased swelling of mouth, worsening pain please seek reevaluation.

## 2023-07-29 NOTE — ED Provider Notes (Signed)
 [facility]    CSN: [id number] Arrival date & time: 07/29/23  1206      History   Chief Complaint Chief Complaint  Patient presents with   Mouth Sore     HPI Ayiden Milliman is a 63 y.o. male presenting for approximately 2-day history of pain and swelling to the left side of the tongue with lesion.  Patient reports no dental pain or sore throat.  Has been coughing for a while but thinks is due to his allergies.  Not taking any cough medication or allergy medication.  Former smoker.  HPI  History reviewed. No pertinent past medical history.  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 Authorizing Provider  amoxicillin-clavulanate (AUGMENTIN) 875-125 MG tablet Take 1 tablet by mouth every 12 (twelve) hours for 7 days. 07/29/23 08/05/23 Yes Eusebio Friendly B, PA-C  lidocaine (XYLOCAINE) 2 % solution Use as directed 15 mLs in the mouth or throat every 3 (three) hours as needed for mouth pain. 07/29/23  Yes Shirlee Latch, PA-C  promethazine-dextromethorphan (PROMETHAZINE-DM) 6.25-15 MG/5ML syrup Take 5 mLs by mouth 4 (four) times daily as needed. 07/29/23  Yes Eusebio Friendly B, PA-C  pantoprazole (PROTONIX) 40 MG tablet Take 40 mg by mouth daily. 09/22/21   [provider]  senna-docusate (SENOKOT-S) 8.6-50 MG tablet Take 2 tablets by mouth at bedtime as needed for mild constipation. 12/17/21   Katha Cabal, DO    Family History Family History  Problem Relation Age of Onset   Healthy Mother    Diabetes Father     Social History Social History   Tobacco Use   Smoking status: Former   Smokeless tobacco: Former  Building services engineer status: Never Used  Substance Use Topics   Alcohol use: No   Drug use: No     Allergies   Patient has no known allergies.   Review of Systems Review of Systems  Constitutional:  Negative for fatigue and fever.  HENT:  Positive for mouth sores. Negative for congestion, rhinorrhea, sinus pressure, sinus pain and sore throat.   Respiratory:  Positive for cough. Negative for shortness of breath.   Cardiovascular:  Negative for chest pain.  Gastrointestinal:  Negative for abdominal pain, diarrhea, nausea and vomiting.  Musculoskeletal:  Negative for myalgias.  Neurological:  Negative for weakness, light-headedness and headaches.  Hematological:  Negative for adenopathy.     Physical Exam Triage Vital Signs ED Triage Vitals  Encounter Vitals Group     BP      Systolic BP Percentile      Diastolic BP Percentile      Pulse      Resp      Temp      Temp src      SpO2      Weight      Height      Head Circumference  Peak Flow      Pain Score      Pain Loc      Pain Education      Exclude from Growth Chart    No data found.  Updated Vital Signs BP 135/84 (BP Location: Right Arm)   Pulse 65   Temp 97.9 F (36.6 C) (Oral)   Resp 18   SpO2 96%      Physical Exam Vitals and nursing note reviewed.  Constitutional:      General: He is not in acute distress.    Appearance: Normal appearance. He is well-developed. He is not ill-appearing.     Comments: +coughs frequently  HENT:     Head: Normocephalic and atraumatic.     Nose: Nose normal.     Mouth/Throat:     Mouth: Mucous membranes are moist.     Dentition: Dental caries present.     Pharynx: Oropharynx is clear.     Comments: There is an area of swelling/erythema  of the left lateral tongue with ulceration (whitish base) that is TTP Eyes:     Conjunctiva/sclera: Conjunctivae normal.  Cardiovascular:     Rate and Rhythm: Normal rate and regular rhythm.  Pulmonary:     Effort: Pulmonary effort is normal. No respiratory distress.     Breath sounds: Normal breath sounds.  Musculoskeletal:     Cervical back: Neck supple.  Skin:    General: Skin is warm and dry.     Capillary Refill: Capillary refill takes less than 2 seconds.  Neurological:     General: No focal deficit present.     Mental Status: He is alert. Mental status is at baseline.     Motor: No weakness.     Gait: Gait normal.  Psychiatric:        Mood and Affect: Mood normal.        Behavior: Behavior normal.      UC Treatments / Results  Labs (all labs ordered are listed, but only abnormal results are displayed) Labs Reviewed - No data to display  EKG   Radiology No results found.  Procedures Procedures (including critical care time)  Medications Ordered in UC Medications - No data to display  Initial Impression / Assessment and Plan / UC Course  I have reviewed the triage vital signs and the nursing notes.  Pertinent labs & imaging results that were available during my care of the patient were reviewed by me and considered in my medical decision making (see chart for details).   63 year old male presents for tongue pain and lesion for the past couple days.  Also reports cough for a while.  History of allergies.  Former smoker.  Vital stable and normal.  Overall well-appearing.  No acute distress.  On exam, has ulcerated tongue lesion with whitish patient surrounding erythema and swelling of the left lateral tongue.  Throat clear.  No obvious dental infection but has dental caries.  Chest clear.  Heart regular rate and rhythm.  Tongue lesion in a former smoker.  Could be bacterial infection versus aphthous ulcer.  Treating at this time with Augmentin and viscous  lidocaine.  Also sent Promethazine DM for cough.  Encouraged him to increase rest and fluids and monitor condition closely.  If lesion not improving over the next 7 to 10 days should be reevaluated.  There is always the concern for malignancy if this is not clearing up since he is a former smoker.   Final Clinical Impressions(s) /  UC Diagnoses   Final diagnoses:  Tongue infection  Tongue lesion  Acute cough  Former smoker     Discharge Instructions      -You have an infection in your tongue.  I sent antibiotics to pharmacy as well as lidocaine solution when she can put on the area to help numb it.  You can also use Orajel.  Tylenol and ibuprofen for pain, plenty of rest and fluids.  I sent cough medicine 2.  Consider getting on an antihistamine since it is allergy season. - If tongue lesion is not going away over the next 7 to 10 days or gets worse seek reevaluation. -If fever, increased swelling of mouth, worsening pain please seek reevaluation.     ED Prescriptions     Medication Sig Dispense Auth. Provider   lidocaine (XYLOCAINE) 2 % solution Use as directed 15 mLs in the mouth or throat every 3 (three) hours as needed for mouth pain. 100 mL Eusebio Friendly B, PA-C   amoxicillin-clavulanate (AUGMENTIN) 875-125 MG tablet Take 1 tablet by mouth every 12 (twelve) hours for 7 days. 14 tablet Shirlee Latch, PA-C   promethazine-dextromethorphan (PROMETHAZINE-DM) 6.25-15 MG/5ML syrup Take 5 mLs by mouth 4 (four) times daily as needed. 118 mL Shirlee Latch, PA-C      PDMP not reviewed this encounter.   Shirlee Latch, PA-C 07/29/23 1409

## 2024-02-12 ENCOUNTER — Encounter: Payer: Self-pay | Admitting: Emergency Medicine

## 2024-02-12 ENCOUNTER — Ambulatory Visit
Admission: EM | Admit: 2024-02-12 | Discharge: 2024-02-12 | Disposition: A | Discharge status: Home / Self Care | Attending: Student | Admitting: Student

## 2024-02-12 DIAGNOSIS — S00522A Blister (nonthermal) of oral cavity, initial encounter: Secondary | ICD-10-CM | POA: Diagnosis not present

## 2024-02-12 MED ORDER — LIDOCAINE VISCOUS HCL 2 % MT SOLN
15.0000 mL | OROMUCOSAL | 0 refills | Status: DC | PRN
Start: 2024-02-12 — End: 2024-03-06

## 2024-02-12 NOTE — Discharge Instructions (Signed)
-  For sore throat, use lidocaine  mouthwash up to every 4 hours. Make sure not to eat for at least 1 hour after using this, as your mouth will be very numb and you could bite yourself. -I have placed a referral to ear nose and throat. Follow-up with them if symptoms persist

## 2024-02-12 NOTE — ED Provider Notes (Signed)
 [facility]    CSN: [id number] Arrival date & time: 02/12/24  1407      History   Chief Complaint Chief Complaint  Patient presents with   Oral Swelling   Blister    HPI Daniel Coleman is a 63 y.o. male presenting with 2 blisters on his tongue for the last hour.  He noticed the blisters after eating fish.  He denies trauma to the tongue.  He is feeling well otherwise.  Denies trouble swallowing, difficulty breathing, fevers, recent URI.  HPI  History reviewed. No pertinent past medical history.  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 Authorizing Provider  lidocaine  (XYLOCAINE ) 2 % solution Use as directed 15 mLs in the mouth or throat every 4 (four) hours as needed for mouth pain. Swish/gargle and spit 02/12/24  Yes Skyrah Krupp E, PA-C  pantoprazole (PROTONIX) 40 MG tablet Take 40 mg by mouth daily. 09/22/21   [provider]  senna-docusate (SENOKOT-S) 8.6-50 MG tablet Take 2 tablets by mouth at bedtime as needed for mild constipation. 12/17/21   Brimage, Vondra, DO    Family History Family History  Problem Relation Age of Onset   Healthy Mother    Diabetes Father     Social History Social History   Tobacco Use   Smoking status: Former   Smokeless tobacco: Former  Building services engineer status: Never Used  Substance Use Topics   Alcohol use: No   Drug use: No      Allergies   Patient has no known allergies.   Review of Systems Review of Systems  HENT:  Positive for mouth sores.      Physical Exam Triage Vital Signs ED Triage Vitals  Encounter Vitals Group     BP 02/12/24 1421 131/85     Girls Systolic BP Percentile --      Girls Diastolic BP Percentile --      Boys Systolic BP Percentile --      Boys Diastolic BP Percentile --      Pulse Rate 02/12/24 1421 80     Resp 02/12/24 1421 16     Temp 02/12/24 1421 98.4 F (36.9 C)     Temp Source 02/12/24 1421 Oral     SpO2 02/12/24 1421 97 %     Weight 02/12/24 1419 175 lb 14.8 oz (79.8 kg)     Height 02/12/24 1419 5' 11 (1.803 m)     Head Circumference --      Peak Flow --      Pain Score 02/12/24 1419 0     Pain Loc --      Pain Education --      Exclude from Growth Chart --    No data found.  Updated Vital Signs BP 131/85 (BP Location: Right Arm)   Pulse 80   Temp 98.4 F (36.9 C) (Oral)   Resp 16  Ht 5' 11 (1.803 m)   Wt 175 lb 14.8 oz (79.8 kg)   SpO2 97%   BMI 24.54 kg/m   Visual Acuity Right Eye Distance:   Left Eye Distance:   Bilateral Distance:    Right Eye Near:   Left Eye Near:    Bilateral Near:     Physical Exam Vitals reviewed.  Constitutional:      General: He is not in acute distress.    Appearance: Normal appearance. He is not ill-appearing.  HENT:     Head: Normocephalic and atraumatic.     Comments: [See image below] Tongue with two 6mm circular blood blisters.     Mouth/Throat:     Comments: No pharyngeal erythema or swelling Pulmonary:     Effort: Pulmonary effort is normal.  Neurological:     General: No focal deficit present.     Mental Status: He is alert and oriented to person, place, and time.  Psychiatric:        Mood and Affect: Mood normal.        Behavior: Behavior normal.        Thought Content: Thought content normal.        Judgment: Judgment normal.       UC Treatments / Results  Labs (all labs ordered  are listed, but only abnormal results are displayed) Labs Reviewed - No data to display  EKG   Radiology No results found.  Procedures Procedures (including critical care time)  Medications Ordered in UC Medications - No data to display  Initial Impression / Assessment and Plan / UC Course  I have reviewed the triage vital signs and the nursing notes.  Pertinent labs & imaging results that were available during my care of the patient were reviewed by me and considered in my medical decision making (see chart for details).     He appears to have two blood blisters on his tongue. They appeared today. I suspect these are from biting tongue, though the patient does not recall specific trauma. There is no infection. He is a former smoker. I provided a script of viscous lidocaine  for symptomatic relief. ENT referral placed for as-needed follow-up.  Final Clinical Impressions(s) / UC Diagnoses   Final diagnoses:  Traumatic blister of tongue     Discharge Instructions      -For sore throat, use lidocaine  mouthwash up to every 4 hours. Make sure not to eat for at least 1 hour after using this, as your mouth will be very numb and you could bite yourself. -I have placed a referral to ear nose and throat. Follow-up with them if symptoms persist      ED Prescriptions     Medication Sig Dispense Auth. Provider   lidocaine  (XYLOCAINE ) 2 % solution Use as directed 15 mLs in the mouth or throat every 4 (four) hours as needed for mouth pain. Swish/gargle and spit 100 mL Amahri Dengel E, PA-C      PDMP not reviewed this encounter.   Arlyss Leita BRAVO, PA-C 02/12/24 [phone]

## 2024-02-12 NOTE — ED Triage Notes (Signed)
 Patient states that he was eating fish and developed two blisters on the side of his tongue today.  Patient denies fevers.

## 2024-03-06 ENCOUNTER — Encounter: Payer: Self-pay | Admitting: Emergency Medicine

## 2024-03-06 ENCOUNTER — Ambulatory Visit
Admission: EM | Admit: 2024-03-06 | Discharge: 2024-03-06 | Disposition: A | Discharge status: Home / Self Care | Attending: Family Medicine | Admitting: Family Medicine

## 2024-03-06 DIAGNOSIS — M25512 Pain in left shoulder: Secondary | ICD-10-CM | POA: Diagnosis not present

## 2024-03-06 DIAGNOSIS — S161XXA Strain of muscle, fascia and tendon at neck level, initial encounter: Secondary | ICD-10-CM

## 2024-03-06 MED ORDER — CYCLOBENZAPRINE HCL 5 MG PO TABS: 5.0000 mg | ORAL_TABLET | Freq: Every evening | ORAL | 0 refills | Status: AC | PRN

## 2024-03-06 MED ORDER — PREDNISONE 10 MG (21) PO TBPK: ORAL_TABLET | Freq: Every day | ORAL | 0 refills | Status: AC

## 2024-03-06 NOTE — ED Triage Notes (Signed)
 Pt c/o left ear pain that radiates down to his neck x 4 days. Pt states it does not hurt to move his neck, but its painful to touch. He also has left shoulder pain for several weeks. He denies any injury.

## 2024-03-06 NOTE — ED Provider Notes (Signed)
 [facility]    CSN: [id number] Arrival date & time: 03/06/24  0818      History   Chief Complaint Chief Complaint  Patient presents with   Neck Pain   Otalgia   Shoulder Pain    HPI  HPI Daniel Coleman is a 63 y.o. male.   Daniel Coleman presents for left sided neck pain for the past couple of says.  At times, his neck pain is so bad he can feel it in his left ear. Has left shoulder pain for the past few weeks. Pain gets worse with driving and sleeping at night. Has throbbing pain.  Nothing tried for symptoms thus far.  No prior neck or shoulder injury.  No falls or trauma. Denies chest pain, shortness of breath, fever.  Has allergies and currently has rhinorrhea.     History reviewed. No pertinent past medical history.  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 Authorizing Provider  cetirizine (ZYRTEC) 10 MG tablet Take 10 mg by mouth daily. 08/28/20  Yes [provider]  cyclobenzaprine  (FLEXERIL ) 5 MG tablet Take 1 tablet (5 mg total) by mouth at bedtime as needed. 03/06/24  Yes Zacharius Funari, DO  predniSONE (STERAPRED UNI-PAK 21 TAB) 10 MG (21) TBPK tablet Take by mouth daily. Take 6 tabs by mouth daily for 1, then 5 tabs for 1 day, then 4 tabs for 1 day, then 3 tabs for 1 day, then 2 tabs for 1 day, then 1 tab for 1 day. 03/06/24  Yes Siboney Requejo, DO  pantoprazole (PROTONIX) 40 MG  tablet Take 40 mg by mouth daily. 09/22/21   [provider]  senna-docusate (SENOKOT-S) 8.6-50 MG tablet Take 2 tablets by mouth at bedtime as needed for mild constipation. 12/17/21   Yeilyn Gent, DO    Family History Family History  Problem Relation Age of Onset   Healthy Mother    Diabetes Father     Social History Social History   Tobacco Use   Smoking status: Former   Smokeless tobacco: Former  Building Services Engineer status: Never Used  Substance Use Topics   Alcohol use: No   Drug use: No     Allergies   Patient has no known allergies.   Review of Systems Review of Systems: :negative unless otherwise stated in HPI.      Physical Exam Triage Vital Signs ED Triage Vitals  Encounter Vitals Group     BP 03/06/24 0851 121/78     Girls Systolic BP Percentile --      Girls Diastolic BP Percentile --      Boys Systolic BP Percentile --      Boys Diastolic BP Percentile --      Pulse Rate 03/06/24 0851 70     Resp 03/06/24 0851 16     Temp 03/06/24 0851 98.4 F (36.9 C)     Temp Source 03/06/24  0851 Oral     SpO2 03/06/24 0851 99 %     Weight --      Height --      Head Circumference --      Peak Flow --      Pain Score 03/06/24 0850 8     Pain Loc --      Pain Education --      Exclude from Growth Chart --    No data found.  Updated Vital Signs BP 121/78   Pulse 70   Temp 98.4 F (36.9 C) (Oral)   Resp 16   SpO2 99%   Visual Acuity Right Eye Distance:   Left Eye Distance:   Bilateral Distance:    Right Eye Near:   Left Eye Near:    Bilateral Near:     Physical Exam GEN: well appearing male in no acute distress  NECK: Normal range of motion in flexion, extension, sidebending and rotation, no midline or lateral column tenderness, + lateral soft tissue tenderness without overlying masses or skin changes CVS: well perfused, regular rate  RESP: speaking in full sentences without pause, no respiratory distress  MSK:   Left  shoulder:   No evidence of bony deformity, asymmetry, or muscle atrophy. No tenderness over long head of biceps (bicipital groove).  No TTP at [facility] joint.  Full active and passive (ABD, ADD, Flexion, extension, IR, ER).  Strength 5/5 grip, elbow and shoulder. No abnormal scapular function observed.  Special Tests: Hawkins: positive; Empty Can: mildly positive, Neer's: Negative; Painful arc: Negative; Anterior Apprehension: Negative Sensation intact. Peripheral pulses intact.   UC Treatments / Results  Labs (all labs ordered are listed, but only abnormal results are displayed) Labs Reviewed - No data to display  EKG   Radiology No results found.   Procedures Procedures (including critical care time)  Medications Ordered in UC Medications - No data to display  Initial Impression / Assessment and Plan / UC Course  I have reviewed the triage vital signs and the nursing notes.  Pertinent labs & imaging results that were available during my care of the patient were reviewed by me and considered in my medical decision making (see chart for details).      Pt is a 63 y.o.  male with 3 weeks of left shoulder pain without known injury. Has had a couple days of left lateral neck pain.  VSS. He is afebrile.   On exam, pt has tenderness at lateral neck. He has no left shoulder bony tenderness therefore left shoulder plain films deferred. Doubt  fracture or dislocation. Suspect arthritic pain vs muscular strain.    Patient to gradually return to normal activities, as tolerated and continue ordinary activities within the limits permitted by pain. Prescribed prednisone taper and muscle relaxer  for pain relief.  Tylenol PRN.  Counseled patient on red flag symptoms and when to seek immediate care.   Patient to follow up with orthopedic provider, if symptoms do not improve with conservative treatment.  Return and ED precautions given. Understanding voiced. Discussed MDM, treatment plan and plan for follow-up  with patient who agrees with plan.   Final Clinical Impressions(s) / UC Diagnoses   Final diagnoses:  Neck strain, initial encounter  Acute pain of left shoulder     Discharge Instructions      If medication was prescribed, stop by the pharmacy to pick up your prescriptions.  For your  pain, Take 1500 mg Tylenol twice a day, take  muscle relaxer (Flexeril /cyclobenzaprine ) at bedtime,  as needed for pain.  Rest and elevate the affected painful area.  Apply warm compresses intermittently, as needed.  As pain recedes, begin normal activities slowly as tolerated.  Follow up with primary care provider or an orthopedic provider, if symptoms persist.  Go to ED for red flag symptoms, including; fevers you cannot reduce with Tylenol/Motrin , severe headaches, vision changes, numbness/weakness in part of the body, lethargy, confusion, intractable vomiting, severe dehydration, chest pain, breathing difficulty, severe persistent abdominal or pelvic pain, signs of severe infection (increased redness, swelling of an area), feeling faint or passing out, dizziness, etc. You should especially go to the ED for sudden acute worsening of condition if you do not elect to go at this time.        ED Prescriptions     Medication Sig Dispense Auth. Provider   predniSONE (STERAPRED UNI-PAK 21 TAB) 10 MG (21) TBPK tablet Take by mouth daily. Take 6 tabs by mouth daily for 1, then 5 tabs for 1 day, then 4 tabs for 1 day, then 3 tabs for 1 day, then 2 tabs for 1 day, then 1 tab for 1 day. 21 tablet Kareen Hitsman, DO   cyclobenzaprine  (FLEXERIL ) 5 MG tablet Take 1 tablet (5 mg total) by mouth at bedtime as needed. 20 tablet Jian Hodgman, DO      PDMP not reviewed this encounter.   Bethania Schlotzhauer, DO 03/06/24 1125

## 2024-03-06 NOTE — Discharge Instructions (Addendum)
 If medication was prescribed, stop by the pharmacy to pick up your prescriptions.  For your  pain, Take 1500 mg Tylenol twice a day, take muscle relaxer (Flexeril /cyclobenzaprine ) at bedtime,  as needed for pain.  Rest and elevate the affected painful area.  Apply warm compresses intermittently, as needed.  As pain recedes, begin normal activities slowly as tolerated.  Follow up with primary care provider or an orthopedic provider, if symptoms persist.  Go to ED for red flag symptoms, including; fevers you cannot reduce with Tylenol/Motrin , severe headaches, vision changes, numbness/weakness in part of the body, lethargy, confusion, intractable vomiting, severe dehydration, chest pain, breathing difficulty, severe persistent abdominal or pelvic pain, signs of severe infection (increased redness, swelling of an area), feeling faint or passing out, dizziness, etc. You should especially go to the ED for sudden acute worsening of condition if you do not elect to go at this time.
